# Patient Record
Sex: Female | Born: 1945 | Race: White | Hispanic: No | Marital: Married | Smoking: Former smoker
Health system: Southern US, Community
[De-identification: ages and names within clinical notes are randomized; demographics above are authoritative.]

## PROBLEM LIST (undated history)

## (undated) DIAGNOSIS — I4891 Unspecified atrial fibrillation: Secondary | ICD-10-CM

## (undated) DIAGNOSIS — F32A Depression, unspecified: Secondary | ICD-10-CM

## (undated) DIAGNOSIS — Z87442 Personal history of urinary calculi: Secondary | ICD-10-CM

## (undated) DIAGNOSIS — I10 Essential (primary) hypertension: Secondary | ICD-10-CM

## (undated) DIAGNOSIS — F419 Anxiety disorder, unspecified: Secondary | ICD-10-CM

## (undated) DIAGNOSIS — C4491 Basal cell carcinoma of skin, unspecified: Secondary | ICD-10-CM

## (undated) DIAGNOSIS — D332 Benign neoplasm of brain, unspecified: Secondary | ICD-10-CM

## (undated) DIAGNOSIS — Z9889 Other specified postprocedural states: Secondary | ICD-10-CM

## (undated) DIAGNOSIS — R238 Other skin changes: Secondary | ICD-10-CM

## (undated) DIAGNOSIS — F329 Major depressive disorder, single episode, unspecified: Secondary | ICD-10-CM

## (undated) DIAGNOSIS — M1990 Unspecified osteoarthritis, unspecified site: Secondary | ICD-10-CM

## (undated) DIAGNOSIS — L409 Psoriasis, unspecified: Secondary | ICD-10-CM

## (undated) DIAGNOSIS — J849 Interstitial pulmonary disease, unspecified: Secondary | ICD-10-CM

## (undated) DIAGNOSIS — C801 Malignant (primary) neoplasm, unspecified: Secondary | ICD-10-CM

## (undated) DIAGNOSIS — R112 Nausea with vomiting, unspecified: Secondary | ICD-10-CM

## (undated) DIAGNOSIS — M48061 Spinal stenosis, lumbar region without neurogenic claudication: Secondary | ICD-10-CM

## (undated) DIAGNOSIS — C439 Malignant melanoma of skin, unspecified: Secondary | ICD-10-CM

## (undated) DIAGNOSIS — R918 Other nonspecific abnormal finding of lung field: Secondary | ICD-10-CM

## (undated) DIAGNOSIS — R233 Spontaneous ecchymoses: Secondary | ICD-10-CM

## (undated) DIAGNOSIS — N281 Cyst of kidney, acquired: Secondary | ICD-10-CM

## (undated) DIAGNOSIS — R12 Heartburn: Secondary | ICD-10-CM

## (undated) HISTORY — PX: NECK SURGERY: SHX720

## (undated) HISTORY — PX: ABDOMINAL HYSTERECTOMY: SHX81

## (undated) HISTORY — PX: GALLBLADDER SURGERY: SHX652

## (undated) HISTORY — PX: COLONOSCOPY: SHX174

## (undated) HISTORY — PX: BRAIN SURGERY: SHX531

## (undated) HISTORY — PX: CATARACT EXTRACTION, BILATERAL: SHX1313

## (undated) HISTORY — PX: BREAST REDUCTION SURGERY: SHX8

---
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
## 2016-02-28 ENCOUNTER — Other Ambulatory Visit: Payer: Self-pay | Admitting: Neurosurgery

## 2016-03-07 ENCOUNTER — Telehealth: Payer: Self-pay | Admitting: Vascular Surgery

## 2016-03-07 NOTE — Telephone Encounter (Signed)
LVM on home # about appt on 2/13, req a call back to confirm they got the appt info, will mail lttr as well

## 2016-03-07 NOTE — Telephone Encounter (Signed)
-----   Message from Denman George, RN sent at 03/07/2016 11:35 AM EST ----- Regarding: needs appt. with Dr. Donnetta Hutching Please schedule appt. with Dr. Donnetta Hutching prior to ALIF scheduled for 04/02/16; please remind her to bring copy of L-S spine films to appt.

## 2016-03-20 ENCOUNTER — Encounter: Payer: Self-pay | Admitting: Vascular Surgery

## 2016-03-21 ENCOUNTER — Other Ambulatory Visit: Payer: Self-pay

## 2016-03-27 ENCOUNTER — Encounter (HOSPITAL_COMMUNITY)
Admission: RE | Admit: 2016-03-27 | Discharge: 2016-03-27 | Disposition: A | Discharge status: Home / Self Care | Payer: Medicare Other | Source: Ambulatory Visit | Attending: Neurosurgery | Admitting: Neurosurgery

## 2016-03-27 ENCOUNTER — Encounter (HOSPITAL_COMMUNITY): Payer: Self-pay

## 2016-03-27 ENCOUNTER — Encounter: Payer: Self-pay | Admitting: Vascular Surgery

## 2016-03-27 ENCOUNTER — Ambulatory Visit (INDEPENDENT_AMBULATORY_CARE_PROVIDER_SITE_OTHER): Payer: Medicare Other | Admitting: Vascular Surgery

## 2016-03-27 VITALS — BP 168/80 | HR 62 | Temp 99.2°F | Resp 18 | Ht 67.0 in | Wt 197.2 lb

## 2016-03-27 DIAGNOSIS — M51379 Other intervertebral disc degeneration, lumbosacral region without mention of lumbar back pain or lower extremity pain: Secondary | ICD-10-CM

## 2016-03-27 DIAGNOSIS — Z7901 Long term (current) use of anticoagulants: Secondary | ICD-10-CM | POA: Insufficient documentation

## 2016-03-27 DIAGNOSIS — Z01812 Encounter for preprocedural laboratory examination: Secondary | ICD-10-CM | POA: Diagnosis not present

## 2016-03-27 DIAGNOSIS — Z79899 Other long term (current) drug therapy: Secondary | ICD-10-CM | POA: Diagnosis not present

## 2016-03-27 DIAGNOSIS — Z01818 Encounter for other preprocedural examination: Secondary | ICD-10-CM | POA: Insufficient documentation

## 2016-03-27 DIAGNOSIS — Z87891 Personal history of nicotine dependence: Secondary | ICD-10-CM | POA: Diagnosis not present

## 2016-03-27 DIAGNOSIS — Z0183 Encounter for blood typing: Secondary | ICD-10-CM | POA: Diagnosis not present

## 2016-03-27 DIAGNOSIS — Z85820 Personal history of malignant melanoma of skin: Secondary | ICD-10-CM | POA: Diagnosis not present

## 2016-03-27 DIAGNOSIS — I10 Essential (primary) hypertension: Secondary | ICD-10-CM | POA: Insufficient documentation

## 2016-03-27 DIAGNOSIS — I4891 Unspecified atrial fibrillation: Secondary | ICD-10-CM | POA: Insufficient documentation

## 2016-03-27 DIAGNOSIS — M48061 Spinal stenosis, lumbar region without neurogenic claudication: Secondary | ICD-10-CM | POA: Insufficient documentation

## 2016-03-27 DIAGNOSIS — F419 Anxiety disorder, unspecified: Secondary | ICD-10-CM | POA: Insufficient documentation

## 2016-03-27 DIAGNOSIS — M5137 Other intervertebral disc degeneration, lumbosacral region: Secondary | ICD-10-CM | POA: Diagnosis not present

## 2016-03-27 DIAGNOSIS — Z85828 Personal history of other malignant neoplasm of skin: Secondary | ICD-10-CM | POA: Insufficient documentation

## 2016-03-27 DIAGNOSIS — F329 Major depressive disorder, single episode, unspecified: Secondary | ICD-10-CM | POA: Insufficient documentation

## 2016-03-27 DIAGNOSIS — T148XXA Other injury of unspecified body region, initial encounter: Secondary | ICD-10-CM

## 2016-03-27 DIAGNOSIS — M1990 Unspecified osteoarthritis, unspecified site: Secondary | ICD-10-CM | POA: Diagnosis not present

## 2016-03-27 DIAGNOSIS — R238 Other skin changes: Secondary | ICD-10-CM

## 2016-03-27 HISTORY — DX: Major depressive disorder, single episode, unspecified: F32.9

## 2016-03-27 HISTORY — DX: Unspecified osteoarthritis, unspecified site: M19.90

## 2016-03-27 HISTORY — DX: Cyst of kidney, acquired: N28.1

## 2016-03-27 HISTORY — DX: Other specified postprocedural states: Z98.890

## 2016-03-27 HISTORY — DX: Heartburn: R12

## 2016-03-27 HISTORY — DX: Anxiety disorder, unspecified: F41.9

## 2016-03-27 HISTORY — DX: Depression, unspecified: F32.A

## 2016-03-27 HISTORY — DX: Benign neoplasm of brain, unspecified: D33.2

## 2016-03-27 HISTORY — DX: Unspecified atrial fibrillation: I48.91

## 2016-03-27 HISTORY — DX: Other specified postprocedural states: R11.2

## 2016-03-27 HISTORY — DX: Other skin changes: R23.8

## 2016-03-27 HISTORY — DX: Spontaneous ecchymoses: R23.3

## 2016-03-27 HISTORY — DX: Personal history of urinary calculi: Z87.442

## 2016-03-27 HISTORY — DX: Essential (primary) hypertension: I10

## 2016-03-27 HISTORY — DX: Spinal stenosis, lumbar region without neurogenic claudication: M48.061

## 2016-03-27 HISTORY — DX: Malignant (primary) neoplasm, unspecified: C80.1

## 2016-03-27 LAB — ABO/RH: ABO/RH(D): O POS

## 2016-03-27 LAB — CBC
HCT: 35.8 % — ABNORMAL LOW (ref 36.0–46.0)
Hemoglobin: 11.7 g/dL — ABNORMAL LOW (ref 12.0–15.0)
MCH: 29.2 pg (ref 26.0–34.0)
MCHC: 32.7 g/dL (ref 30.0–36.0)
MCV: 89.3 fL (ref 78.0–100.0)
Platelets: 207 10*3/uL (ref 150–400)
RBC: 4.01 MIL/uL (ref 3.87–5.11)
RDW: 12.9 % (ref 11.5–15.5)
WBC: 9.5 10*3/uL (ref 4.0–10.5)

## 2016-03-27 LAB — BASIC METABOLIC PANEL
Anion gap: 10 (ref 5–15)
BUN: 19 mg/dL (ref 6–20)
CO2: 27 mmol/L (ref 22–32)
Calcium: 9.5 mg/dL (ref 8.9–10.3)
Chloride: 102 mmol/L (ref 101–111)
Creatinine, Ser: 0.87 mg/dL (ref 0.44–1.00)
GFR calc Af Amer: >60 mL/min (ref >60)
GFR calc non Af Amer: >60 mL/min (ref >60)
Glucose, Bld: 110 mg/dL — ABNORMAL HIGH (ref 65–99)
Potassium: 3.9 mmol/L (ref 3.5–5.1)
Sodium: 139 mmol/L (ref 135–145)

## 2016-03-27 LAB — TYPE AND SCREEN
ABO/RH(D): O POS
Antibody Screen: NEGATIVE

## 2016-03-27 LAB — SURGICAL PCR SCREEN
MRSA, PCR: NEGATIVE
Staphylococcus aureus: NEGATIVE

## 2016-03-27 MED ORDER — CARRASYN HYDROGEL WOUND DRESS EX GEL
CUTANEOUS | 0 refills | Status: DC | PRN
Start: 2016-03-27 — End: 2017-04-02

## 2016-03-27 NOTE — Progress Notes (Signed)
Vascular and Vein Specialist of [facility]  Patient name: Rebecca Houston MRN: [id number] DOB: 12/09/1945 Sex: female  REASON FOR CONSULT: Discuss anterior exposure for L5-S1 disc surgery  HPI: Rebecca Houston is a 71 y.o. female, who is seen for severe degenerative disc disease. She reports this is been progressive over the course of several years and now has difficulty with pain in both lower extremities as well. She has had injections with minimal relief in his had persistent pain. She's been seen in consultation with Dr. Vertell Limber who is recommended anterior exposure for L5-S1 disc fusion. She is seeing me for further discussion of this today. She's had no prior abdominal surgeries. She has no history of peripheral vascular occlusive disease. She does have a recent skin tear over her left pretibial area approximately 3 weeks ago. She reports that she struck a laundry door and had a skin tear which is been very slow to heal. She has had appropriate treatment to date with normal saline wet-to-dry. She has a very nice granulating base.  Past Medical History:  Diagnosis Date  . A-fib ([place])   . Anxiety   . Arthritis   . Brain tumor (benign) ([place])   . Bruises easily   . Cancer ([place])    melanoma on neck  . Depression   . Heartburn   . History of kidney stones   . Hypertension   . Lumbar foraminal stenosis    L5-S1  . PONV (postoperative nausea and vomiting)   . Renal cyst     Family History  Problem Relation Age of Onset  . Stroke Mother   . Dementia Mother   . Heart disease Father   . Breast cancer Sister     SOCIAL HISTORY: Social History   Social History  . Marital status: Married    Spouse name: N/A  . Number of children: N/A  . Years of education: N/A   Occupational History  . Not on file.   Social History Main Topics  . Smoking status: Former Research scientist (life sciences)  . Smokeless tobacco: Never Used     Comment: quit smoking cigarettes in 2014  .  Alcohol use No  . Drug use: No  . Sexual activity: Not on file   Other Topics Concern  . Not on file   Social History Narrative  . No narrative on file    No Known Allergies  Current Outpatient Prescriptions  Medication Sig Dispense Refill  . ALPRAZolam (XANAX) 0.5 MG tablet Take 0.5 mg by mouth 2 (two) times daily as needed for anxiety.    . carvedilol (COREG) 12.5 MG tablet Take 12.5 mg by mouth 2 (two) times daily with a meal.    . escitalopram (LEXAPRO) 20 MG tablet Take 20 mg by mouth every evening.    Marland Kitchen HYDROcodone-acetaminophen (NORCO/VICODIN) 5-325 MG tablet Take 1 tablet by mouth every 4 (four) hours as needed for moderate pain.    Marland Kitchen lisinopril (PRINIVIL,ZESTRIL) 20 MG tablet Take 20 mg by mouth 2 (two) times daily.    Marland Kitchen omeprazole (PRILOSEC) 20 MG capsule Take 20 mg by mouth every other day.    . rivaroxaban (XARELTO) 20 MG TABS tablet Take 20 mg by mouth daily with supper.    . cephALEXin (KEFLEX) 500 MG capsule Take 500 mg by mouth 3 (three) times daily. 10 day course started 03-12-2016 for wound on leg    . mupirocin ointment (BACTROBAN) 2 % Place 1 application into the nose daily as needed (wound care).    Marland Kitchen  Wound Dressings (ALLANTOIN) gel Apply topically as needed for wound care. Thin application over wound and cover with dry dressing wrapped around leg. 85 g 0   No current facility-administered medications for this visit.     REVIEW OF SYSTEMS:  [X]  denotes positive finding, [ ]  denotes negative finding Cardiac  Comments:  Chest pain or chest pressure:    Shortness of breath upon exertion:    Short of breath when lying flat:    Irregular heart rhythm:        Vascular    Pain in calf, thigh, or hip brought on by ambulation: x Neurogenic   Pain in feet at night that wakes you up from your sleep:     Blood clot in your veins:    Leg swelling:         Pulmonary    Oxygen at home:    Productive cough:     Wheezing:         Neurologic    Sudden weakness in  arms or legs:     Sudden numbness in arms or legs:     Sudden onset of difficulty speaking or slurred speech:    Temporary loss of vision in one eye:     Problems with dizziness:         Gastrointestinal    Blood in stool:     Vomited blood:         Genitourinary    Burning when urinating:     Blood in urine:        Psychiatric    Major depression:         Hematologic    Bleeding problems:    Problems with blood clotting too easily:        Skin    Rashes or ulcers:        Constitutional    Fever or chills:      PHYSICAL EXAM: Vitals:   03/27/16 1456 03/27/16 1510  BP: (!) 179/80 (!) 168/80  Pulse: 62   Resp: 18   Temp: 99.2 F (37.3 C)   TempSrc: Oral   SpO2: 99%   Weight: 197 lb 3.2 oz (89.4 kg)   Height: 5\' 7"  (1.702 m)     GENERAL: The patient is a well-nourished female, in no acute distress. The vital signs are documented above. CARDIOVASCULAR: Carotid arteries without bruits bilaterally. Radial and dorsalis pedis pulses 2+ bilaterally PULMONARY: There is good air exchange  ABDOMEN: Soft and non-tender  MUSCULOSKELETAL: There are no major deformities or cyanosis. NEUROLOGIC: No focal weakness or paresthesias are detected. SKIN: She does have a 3 x 3 cm ulceration over her left medial anterior calf is a good granulating base PSYCHIATRIC: The patient has a normal affect.  DATA:  She has an MRI but no plain films or CT to evaluate calcifications of her aortoiliac system  MEDICAL ISSUES: Discussed my role for anterior exposure for L5-S1 spine surgery. Discussed mobilization of the rectus muscle, intraperitoneal contents, left ureter and arterial venous structures overlying the spine. Also explained the potential for injury to these. Do not see any contraindication to proceeding. I did discuss the issue of her skin tear with Dr. Vertell Limber. I do not see any evidence of gross infection and he feels comfortable proceeding with surgery as scheduled on 2/19   Rosetta Posner, MD [facility] Vascular and Vein Specialists of [facility] Tel [phone] Pager [phone]

## 2016-03-27 NOTE — Pre-Procedure Instructions (Signed)
    Rebecca Houston  03/27/2016      [facility] [name], [address] [place] [address] Phone: [phone] Fax: [phone]    Your procedure is scheduled on Monday, April 02, 2016  Report to [facility] Admitting at 5:30 A.M.  Call this number if you have problems the morning of surgery:  [phone]   Remember: Follow doctors instructions regarding Xarelto  Do not eat food or drink liquids after midnight Sunday, April 01, 2016  Take these medicines the morning of surgery with A SIP OF WATER : carvedilol (COREG),  omeprazole (PRILOSEC), if needed: Hydrocodone for pain,ALPRAZolam [name]) for anxiety  Stop taking Aspirin, vitamins, fish oil and herbal medications. Do not take any NSAIDs ie: Ibuprofen, Advil, Naproxen, BC and Goody Powder or any medication containing Aspirin; stop now.  Do not wear jewelry, make-up or nail polish.  Do not wear lotions, powders, or perfumes, or deoderant.  Do not shave 48 hours prior to surgery.    Do not bring valuables to the hospital.  [facility] is not responsible for any belongings or valuables.  Contacts, dentures or bridgework may not be worn into surgery.  Leave your suitcase in the car.  After surgery it may be brought to your room. For patients admitted to the hospital, discharge time will be determined by your treatment team. Special instructions: Shower the night before surgery and the morning of surgery with CHG. Please read over the following fact sheets that you were given. Pain Booklet, Coughing and Deep Breathing, Blood Transfusion Information, MRSA Information and Surgical Site Infection Prevention

## 2016-03-27 NOTE — Progress Notes (Signed)
Pt denies SOB and chest pain. Pt stated that she is under the care of Dr. Burney Gauze, Cardiology at [facility]   [place], [place]). Pt denies having a cardiac cath but stated that a stress test and echo was performed; records requested from cardiologist. Pt denies having recent labs but stated that an EKG and chest x ray was performed within the last year at [place], [facility] by PCP Dr. Tawny Asal; records requested. Janett Billow clarified that pt is to take last dose of Xarelto on Friday, 03/30/16 ( instructions hand written on pt pre-op instruction sheet and pt verbalized understanding of stopping Xarelto 48 hours prior to procedure) when I informed her that pt stated that she took her last dose of Xarelto yesterday because she received a letter to stop 5-7 days prior to procedure.  Pt chart forwarded to anesthesia to review pt cardiac history and clearance note on chart.

## 2016-03-28 NOTE — Progress Notes (Addendum)
Anesthesia Chart Review: Patient is a 71 year old female scheduled for L5-S1 anterior lumbar interbody fusion on 04/02/16 by Dr. Vertell Limber. Anterior exposure by Dr. Curt Jews.  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  Cardiologist is Dr. Robin Searing with [place], [facility] ([place]). He wrote, "I think she is at acceptable risk level to proceed with planned surgery from a cardiac standpoint. She should hold Xarelto for 48 hours prior to surgery so last oh should be 72 hours prior to surgery. Resume unable after surgery, usually 48-72 hours afterwards."  PCP is Dr. Pat Kocher with [place], [place].  Meds include Xanax, Coreg, Keflex X 10 days (started 03/12/16), Lexapro, Norco, lisinopril, Prilosec, Xarelto (patient had stopped on 03/26/16 but was informed at PAT that per Dr. Melven Sartorius staff, last dose should be 03/30/16).   BP (P) 134/62   Pulse (P) 64   Temp (P) 36.2 C (Oral)   Resp (P) 20   Ht (P) 5\' 7"  (1.702 m)   Wt (P) 192 lb 9 oz (87.3 kg)   SpO2 (P) 100%   BMI (P) 30.16 kg/m   EKG 09/21/15 (PCP): Sinus bradycardia at 46 bpm, poor R-wave progression, probable normal variant. (HR 64 bpm at PAT.)  Last EKG, stress, and echo report requested from Dr. West Pugh office, but are still pending. However, the following were outlined in Dr. West Pugh 08/15/15 office note: - ECHO 02/2013: EF 55-85%, concentric LVH. -Stress echo 02/2013: Walk 7 minutes, no ischemia. -PFTs 01/2013: Mild restriction.  CXR 09/21/15 (PCP): Findings: The trachea is midline. The heart size is within normal limits. The lungs are adequately aerated. There is no focal consolidation. There is no pneumothorax noted. There is no evidence  of decompensated heart failure. There is no evidence of pleural effusion. Impression: No acute pulmonary process.  Preoperative labs noted. H/H 11.7/35.8. Glucose 110, Cr 0.87. T&S done. She is for a PT/INR on the day of surgery.  If no acute changes then I anticipate that she can proceed as planned.  George Hugh [facility] Short Stay Center/Anesthesiology Phone [phone] 03/28/2016 1:27 PM  Addendum: Records received from [facility].  Stress Echo 02/16/13:  Stress ECG: Sinus tachycardia. The stress ECG was negative for ischemia. Maximal heart rate during stress was 141 bpm (92% of maximal predicted heart rate). Stress echo: Left ventricular ejection fraction was normal at rest and with stress. Baseline LVEF 60%. No LV regional wall motion abnormalities at rest or with stress.   Echo 02/16/13: Summary: 1. Left ventricle: The cavity size was normal. Wall thickness was mildly to moderately increased in a pattern of concentric LVH. Systolic function was normal. The estimated EF was in the range of 55-65%. 2. Right ventricle: The cavity size was normal. Systolic function was normal. 3. Pulmonary arteries: Systolic pressure could not be accurately estimated. 4. No significant valvular abnormalities. Trivial mitral regurgitation. Trivial to mild tricuspid regurgitation. Trivial pulmonic regurgitation.  George Hugh [facility] Short Stay Center/Anesthesiology Phone [phone] 03/30/2016 12:20 PM

## 2016-04-02 ENCOUNTER — Inpatient Hospital Stay (HOSPITAL_COMMUNITY): Payer: Medicare Other

## 2016-04-02 ENCOUNTER — Inpatient Hospital Stay (HOSPITAL_COMMUNITY): Payer: Medicare Other | Admitting: Vascular Surgery

## 2016-04-02 ENCOUNTER — Encounter (HOSPITAL_COMMUNITY): Payer: Self-pay | Admitting: *Deleted

## 2016-04-02 ENCOUNTER — Inpatient Hospital Stay (HOSPITAL_COMMUNITY)
Admission: RE | Admit: 2016-04-02 | Discharge: 2016-04-04 | DRG: 460 | Disposition: A | Discharge status: Home / Self Care | Payer: Medicare Other | Source: Ambulatory Visit | Attending: Neurosurgery | Admitting: Neurosurgery

## 2016-04-02 ENCOUNTER — Encounter (HOSPITAL_COMMUNITY)
Admission: RE | Disposition: A | Discharge status: Home / Self Care | Payer: Self-pay | Source: Ambulatory Visit | Attending: Neurosurgery

## 2016-04-02 DIAGNOSIS — Z6830 Body mass index (BMI) 30.0-30.9, adult: Secondary | ICD-10-CM | POA: Diagnosis not present

## 2016-04-02 DIAGNOSIS — I10 Essential (primary) hypertension: Secondary | ICD-10-CM | POA: Diagnosis present

## 2016-04-02 DIAGNOSIS — C439 Malignant melanoma of skin, unspecified: Secondary | ICD-10-CM | POA: Diagnosis present

## 2016-04-02 DIAGNOSIS — M48062 Spinal stenosis, lumbar region with neurogenic claudication: Secondary | ICD-10-CM

## 2016-04-02 DIAGNOSIS — F419 Anxiety disorder, unspecified: Secondary | ICD-10-CM | POA: Diagnosis present

## 2016-04-02 DIAGNOSIS — M5117 Intervertebral disc disorders with radiculopathy, lumbosacral region: Secondary | ICD-10-CM | POA: Diagnosis present

## 2016-04-02 DIAGNOSIS — I4891 Unspecified atrial fibrillation: Secondary | ICD-10-CM | POA: Diagnosis present

## 2016-04-02 DIAGNOSIS — M5126 Other intervertebral disc displacement, lumbar region: Secondary | ICD-10-CM | POA: Diagnosis present

## 2016-04-02 DIAGNOSIS — Z8249 Family history of ischemic heart disease and other diseases of the circulatory system: Secondary | ICD-10-CM | POA: Diagnosis not present

## 2016-04-02 DIAGNOSIS — M5137 Other intervertebral disc degeneration, lumbosacral region: Secondary | ICD-10-CM

## 2016-04-02 DIAGNOSIS — M48061 Spinal stenosis, lumbar region without neurogenic claudication: Secondary | ICD-10-CM | POA: Diagnosis present

## 2016-04-02 DIAGNOSIS — Z87891 Personal history of nicotine dependence: Secondary | ICD-10-CM

## 2016-04-02 DIAGNOSIS — M549 Dorsalgia, unspecified: Secondary | ICD-10-CM | POA: Diagnosis present

## 2016-04-02 DIAGNOSIS — Z7901 Long term (current) use of anticoagulants: Secondary | ICD-10-CM | POA: Diagnosis not present

## 2016-04-02 DIAGNOSIS — Z419 Encounter for procedure for purposes other than remedying health state, unspecified: Secondary | ICD-10-CM

## 2016-04-02 DIAGNOSIS — K219 Gastro-esophageal reflux disease without esophagitis: Secondary | ICD-10-CM | POA: Diagnosis present

## 2016-04-02 HISTORY — PX: ANTERIOR LUMBAR FUSION: SHX1170

## 2016-04-02 HISTORY — PX: ABDOMINAL EXPOSURE: SHX5708

## 2016-04-02 LAB — PROTIME-INR
INR: 0.92
Prothrombin Time: 12.3 seconds (ref 11.4–15.2)

## 2016-04-02 SURGERY — ANTERIOR LUMBAR FUSION 1 LEVEL
Anesthesia: General

## 2016-04-02 MED ORDER — MUPIROCIN 2 % EX OINT: 1.0000 "application " | TOPICAL_OINTMENT | Freq: Every day | CUTANEOUS | Status: DC | PRN

## 2016-04-02 MED ORDER — ARTIFICIAL TEARS OP OINT
TOPICAL_OINTMENT | OPHTHALMIC | Status: AC
  Filled 2016-04-02: qty 3.5

## 2016-04-02 MED ORDER — PHENYLEPHRINE HCL 10 MG/ML IJ SOLN
INTRAMUSCULAR | Status: DC | PRN
  Administered 2016-04-02: 40 mg via INTRAVENOUS
  Administered 2016-04-02: 60 mg via INTRAVENOUS

## 2016-04-02 MED ORDER — ESCITALOPRAM OXALATE 20 MG PO TABS
20.0000 mg | ORAL_TABLET | Freq: Every evening | ORAL | Status: DC
  Administered 2016-04-02 – 2016-04-03 (×2): 20 mg via ORAL
  Filled 2016-04-02 (×2): qty 1

## 2016-04-02 MED ORDER — LIDOCAINE 2% (20 MG/ML) 5 ML SYRINGE
INTRAMUSCULAR | Status: AC
  Filled 2016-04-02: qty 5

## 2016-04-02 MED ORDER — DEXAMETHASONE SODIUM PHOSPHATE 10 MG/ML IJ SOLN
INTRAMUSCULAR | Status: AC
  Filled 2016-04-02: qty 1

## 2016-04-02 MED ORDER — FENTANYL CITRATE (PF) 100 MCG/2ML IJ SOLN
INTRAMUSCULAR | Status: AC
  Filled 2016-04-02: qty 4

## 2016-04-02 MED ORDER — THROMBIN 5000 UNITS EX SOLR
CUTANEOUS | Status: AC
  Filled 2016-04-02: qty 5000

## 2016-04-02 MED ORDER — PHENOL 1.4 % MT LIQD: 1.0000 | OROMUCOSAL | Status: DC | PRN

## 2016-04-02 MED ORDER — PROMETHAZINE HCL 25 MG/ML IJ SOLN: 6.2500 mg | INTRAMUSCULAR | Status: DC | PRN

## 2016-04-02 MED ORDER — HEMOSTATIC AGENTS (NO CHARGE) OPTIME
TOPICAL | Status: DC | PRN
  Administered 2016-04-02: 1 via TOPICAL

## 2016-04-02 MED ORDER — KCL IN DEXTROSE-NACL 20-5-0.45 MEQ/L-%-% IV SOLN
INTRAVENOUS | Status: DC
  Administered 2016-04-02: 16:00:00 via INTRAVENOUS
  Filled 2016-04-02: qty 1000

## 2016-04-02 MED ORDER — THROMBIN 5000 UNITS EX SOLR
CUTANEOUS | Status: AC
  Filled 2016-04-02: qty 10000

## 2016-04-02 MED ORDER — ACETAMINOPHEN 650 MG RE SUPP: 650.0000 mg | RECTAL | Status: DC | PRN

## 2016-04-02 MED ORDER — THROMBIN 5000 UNITS EX SOLR
CUTANEOUS | Status: DC | PRN
  Administered 2016-04-02 (×2): 5000 [IU] via TOPICAL

## 2016-04-02 MED ORDER — CEFAZOLIN SODIUM-DEXTROSE 2-4 GM/100ML-% IV SOLN
2.0000 g | INTRAVENOUS | Status: AC
  Administered 2016-04-02: 2 g via INTRAVENOUS
  Filled 2016-04-02: qty 100

## 2016-04-02 MED ORDER — THROMBIN 5000 UNITS EX SOLR
OROMUCOSAL | Status: DC | PRN
  Administered 2016-04-02: 09:00:00 via TOPICAL

## 2016-04-02 MED ORDER — ESMOLOL HCL 100 MG/10ML IV SOLN
INTRAVENOUS | Status: AC
  Filled 2016-04-02: qty 10

## 2016-04-02 MED ORDER — CHLORHEXIDINE GLUCONATE CLOTH 2 % EX PADS: 6.0000 | MEDICATED_PAD | Freq: Once | CUTANEOUS | Status: DC

## 2016-04-02 MED ORDER — SUGAMMADEX SODIUM 200 MG/2ML IV SOLN
INTRAVENOUS | Status: DC | PRN
  Administered 2016-04-02: 200 mg via INTRAVENOUS

## 2016-04-02 MED ORDER — GLYCOPYRROLATE 0.2 MG/ML IJ SOLN
INTRAMUSCULAR | Status: DC | PRN
  Administered 2016-04-02: .2 mg via INTRAVENOUS

## 2016-04-02 MED ORDER — ONDANSETRON HCL 4 MG/2ML IJ SOLN
INTRAMUSCULAR | Status: AC
  Filled 2016-04-02: qty 2

## 2016-04-02 MED ORDER — SUGAMMADEX SODIUM 200 MG/2ML IV SOLN
INTRAVENOUS | Status: AC
  Filled 2016-04-02: qty 2

## 2016-04-02 MED ORDER — FENTANYL CITRATE (PF) 100 MCG/2ML IJ SOLN
INTRAMUSCULAR | Status: AC
  Filled 2016-04-02: qty 2

## 2016-04-02 MED ORDER — POLYETHYLENE GLYCOL 3350 17 G PO PACK
17.0000 g | PACK | Freq: Every day | ORAL | Status: DC | PRN
  Administered 2016-04-02: 17 g via ORAL
  Filled 2016-04-02: qty 1

## 2016-04-02 MED ORDER — MENTHOL 3 MG MT LOZG: 1.0000 | LOZENGE | OROMUCOSAL | Status: DC | PRN

## 2016-04-02 MED ORDER — SCOPOLAMINE 1 MG/3DAYS TD PT72
MEDICATED_PATCH | TRANSDERMAL | Status: DC | PRN
  Administered 2016-04-02: 1 via TRANSDERMAL

## 2016-04-02 MED ORDER — PROPOFOL 10 MG/ML IV BOLUS
INTRAVENOUS | Status: AC
  Filled 2016-04-02: qty 20

## 2016-04-02 MED ORDER — METHOCARBAMOL 500 MG PO TABS
500.0000 mg | ORAL_TABLET | Freq: Four times a day (QID) | ORAL | Status: DC | PRN
  Administered 2016-04-02 – 2016-04-04 (×4): 500 mg via ORAL
  Filled 2016-04-02 (×3): qty 1

## 2016-04-02 MED ORDER — EPHEDRINE SULFATE 50 MG/ML IJ SOLN
INTRAMUSCULAR | Status: DC | PRN
  Administered 2016-04-02 (×2): 10 mg via INTRAVENOUS

## 2016-04-02 MED ORDER — MIDAZOLAM HCL 2 MG/2ML IJ SOLN: 0.5000 mg | Freq: Once | INTRAMUSCULAR | Status: DC | PRN

## 2016-04-02 MED ORDER — METHOCARBAMOL 500 MG PO TABS
ORAL_TABLET | ORAL | Status: AC
  Administered 2016-04-02: 500 mg via ORAL
  Filled 2016-04-02: qty 1

## 2016-04-02 MED ORDER — DEXAMETHASONE SODIUM PHOSPHATE 10 MG/ML IJ SOLN
INTRAMUSCULAR | Status: DC | PRN
  Administered 2016-04-02: 10 mg via INTRAVENOUS

## 2016-04-02 MED ORDER — ESMOLOL HCL 100 MG/10ML IV SOLN
INTRAVENOUS | Status: DC | PRN
  Administered 2016-04-02: 15 mg via INTRAVENOUS
  Administered 2016-04-02: 20 mg via INTRAVENOUS

## 2016-04-02 MED ORDER — SODIUM CHLORIDE 0.9% FLUSH
3.0000 mL | Freq: Two times a day (BID) | INTRAVENOUS | Status: DC
  Administered 2016-04-02: 3 mL via INTRAVENOUS

## 2016-04-02 MED ORDER — ONDANSETRON HCL 4 MG/2ML IJ SOLN
INTRAMUSCULAR | Status: DC | PRN
  Administered 2016-04-02: 4 mg via INTRAVENOUS

## 2016-04-02 MED ORDER — SCOPOLAMINE 1 MG/3DAYS TD PT72
MEDICATED_PATCH | TRANSDERMAL | Status: AC
  Filled 2016-04-02: qty 1

## 2016-04-02 MED ORDER — ACETAMINOPHEN 325 MG PO TABS: 650.0000 mg | ORAL_TABLET | Freq: Four times a day (QID) | ORAL | Status: DC | PRN

## 2016-04-02 MED ORDER — CEFAZOLIN SODIUM-DEXTROSE 2-4 GM/100ML-% IV SOLN
2.0000 g | Freq: Three times a day (TID) | INTRAVENOUS | Status: AC
  Administered 2016-04-02 (×2): 2 g via INTRAVENOUS
  Filled 2016-04-02 (×2): qty 100

## 2016-04-02 MED ORDER — ROCURONIUM BROMIDE 100 MG/10ML IV SOLN
INTRAVENOUS | Status: DC | PRN
  Administered 2016-04-02: 50 mg via INTRAVENOUS
  Administered 2016-04-02: 20 mg via INTRAVENOUS

## 2016-04-02 MED ORDER — FENTANYL CITRATE (PF) 100 MCG/2ML IJ SOLN
INTRAMUSCULAR | Status: DC | PRN
  Administered 2016-04-02: 100 ug via INTRAVENOUS
  Administered 2016-04-02: 50 ug via INTRAVENOUS
  Administered 2016-04-02: 150 ug via INTRAVENOUS

## 2016-04-02 MED ORDER — LISINOPRIL 20 MG PO TABS
20.0000 mg | ORAL_TABLET | Freq: Two times a day (BID) | ORAL | Status: DC
  Administered 2016-04-02 – 2016-04-03 (×3): 20 mg via ORAL
  Filled 2016-04-02 (×3): qty 1

## 2016-04-02 MED ORDER — CEPHALEXIN 500 MG PO CAPS: 500.0000 mg | ORAL_CAPSULE | Freq: Three times a day (TID) | ORAL | Status: DC

## 2016-04-02 MED ORDER — LACTATED RINGERS IV SOLN
INTRAVENOUS | Status: DC | PRN
  Administered 2016-04-02 (×3): via INTRAVENOUS

## 2016-04-02 MED ORDER — HYDROCODONE-ACETAMINOPHEN 5-325 MG PO TABS: 1.0000 | ORAL_TABLET | ORAL | Status: DC | PRN

## 2016-04-02 MED ORDER — MIDAZOLAM HCL 5 MG/5ML IJ SOLN
INTRAMUSCULAR | Status: DC | PRN
  Administered 2016-04-02: 2 mg via INTRAVENOUS

## 2016-04-02 MED ORDER — EPHEDRINE 5 MG/ML INJ
INTRAVENOUS | Status: AC
  Filled 2016-04-02: qty 10

## 2016-04-02 MED ORDER — LIDOCAINE HCL (CARDIAC) 20 MG/ML IV SOLN
INTRAVENOUS | Status: DC | PRN
  Administered 2016-04-02: 20 mg via INTRATRACHEAL
  Administered 2016-04-02: 80 mg via INTRATRACHEAL

## 2016-04-02 MED ORDER — OXYCODONE HCL 5 MG PO TABS
ORAL_TABLET | ORAL | Status: AC
  Administered 2016-04-02: 10 mg via ORAL
  Filled 2016-04-02: qty 2

## 2016-04-02 MED ORDER — HYDROMORPHONE HCL 2 MG/ML IJ SOLN
INTRAMUSCULAR | Status: AC
  Filled 2016-04-02: qty 1

## 2016-04-02 MED ORDER — 0.9 % SODIUM CHLORIDE (POUR BTL) OPTIME
TOPICAL | Status: DC | PRN
  Administered 2016-04-02: 1000 mL

## 2016-04-02 MED ORDER — FLEET ENEMA 7-19 GM/118ML RE ENEM: 1.0000 | ENEMA | Freq: Once | RECTAL | Status: DC | PRN

## 2016-04-02 MED ORDER — ACETAMINOPHEN 650 MG RE SUPP: 650.0000 mg | Freq: Four times a day (QID) | RECTAL | Status: DC | PRN

## 2016-04-02 MED ORDER — ROCURONIUM BROMIDE 50 MG/5ML IV SOSY
PREFILLED_SYRINGE | INTRAVENOUS | Status: AC
  Filled 2016-04-02: qty 5

## 2016-04-02 MED ORDER — OXYCODONE HCL 5 MG PO TABS
5.0000 mg | ORAL_TABLET | ORAL | Status: DC | PRN
  Administered 2016-04-02 (×2): 10 mg via ORAL
  Administered 2016-04-02: 5 mg via ORAL
  Administered 2016-04-03 – 2016-04-04 (×4): 10 mg via ORAL
  Filled 2016-04-02: qty 2
  Filled 2016-04-02: qty 1
  Filled 2016-04-02 (×4): qty 2
  Filled 2016-04-02: qty 1

## 2016-04-02 MED ORDER — MIDAZOLAM HCL 2 MG/2ML IJ SOLN
INTRAMUSCULAR | Status: AC
  Filled 2016-04-02: qty 2

## 2016-04-02 MED ORDER — ACETAMINOPHEN 325 MG PO TABS
650.0000 mg | ORAL_TABLET | ORAL | Status: DC | PRN
Start: 2016-04-02 — End: 2016-04-04

## 2016-04-02 MED ORDER — MORPHINE SULFATE (PF) 4 MG/ML IV SOLN: 2.0000 mg | INTRAVENOUS | Status: DC | PRN

## 2016-04-02 MED ORDER — DEXMEDETOMIDINE HCL 200 MCG/2ML IV SOLN
INTRAVENOUS | Status: DC | PRN
  Administered 2016-04-02: 12 ug via INTRAVENOUS

## 2016-04-02 MED ORDER — PROPOFOL 10 MG/ML IV BOLUS
INTRAVENOUS | Status: DC | PRN
  Administered 2016-04-02: 150 mg via INTRAVENOUS

## 2016-04-02 MED ORDER — ALPRAZOLAM 0.5 MG PO TABS
0.5000 mg | ORAL_TABLET | Freq: Two times a day (BID) | ORAL | Status: DC | PRN
  Administered 2016-04-03 (×2): 0.5 mg via ORAL
  Filled 2016-04-02 (×2): qty 1

## 2016-04-02 MED ORDER — HYDROMORPHONE HCL 1 MG/ML IJ SOLN
0.2500 mg | INTRAMUSCULAR | Status: DC | PRN
  Administered 2016-04-02 (×2): 0.5 mg via INTRAVENOUS

## 2016-04-02 MED ORDER — CARRASYN HYDROGEL WOUND DRESS EX GEL: CUTANEOUS | Status: DC | PRN

## 2016-04-02 MED ORDER — DEXTROSE 5 % IV SOLN
500.0000 mg | Freq: Four times a day (QID) | INTRAVENOUS | Status: DC | PRN
  Filled 2016-04-02: qty 5

## 2016-04-02 MED ORDER — CARVEDILOL 6.25 MG PO TABS
12.5000 mg | ORAL_TABLET | Freq: Two times a day (BID) | ORAL | Status: DC
  Administered 2016-04-02 – 2016-04-03 (×3): 12.5 mg via ORAL
  Filled 2016-04-02 (×3): qty 2

## 2016-04-02 MED ORDER — MEPERIDINE HCL 25 MG/ML IJ SOLN: 6.2500 mg | INTRAMUSCULAR | Status: DC | PRN

## 2016-04-02 MED ORDER — DOCUSATE SODIUM 100 MG PO CAPS
100.0000 mg | ORAL_CAPSULE | Freq: Two times a day (BID) | ORAL | Status: DC
  Administered 2016-04-02 – 2016-04-03 (×4): 100 mg via ORAL
  Filled 2016-04-02 (×4): qty 1

## 2016-04-02 MED ORDER — SODIUM CHLORIDE 0.9% FLUSH: 3.0000 mL | INTRAVENOUS | Status: DC | PRN

## 2016-04-02 MED ORDER — ZOLPIDEM TARTRATE 5 MG PO TABS: 5.0000 mg | ORAL_TABLET | Freq: Every evening | ORAL | Status: DC | PRN

## 2016-04-02 MED ORDER — CHLORHEXIDINE GLUCONATE 4 % EX LIQD: 60.0000 mL | Freq: Once | CUTANEOUS | Status: DC

## 2016-04-02 MED ORDER — BISACODYL 10 MG RE SUPP: 10.0000 mg | Freq: Every day | RECTAL | Status: DC | PRN

## 2016-04-02 MED ORDER — PANTOPRAZOLE SODIUM 40 MG PO TBEC
40.0000 mg | DELAYED_RELEASE_TABLET | Freq: Every day | ORAL | Status: DC
  Administered 2016-04-02 – 2016-04-03 (×2): 40 mg via ORAL
  Filled 2016-04-02 (×2): qty 1

## 2016-04-02 MED ORDER — DEXMEDETOMIDINE HCL IN NACL 200 MCG/50ML IV SOLN
INTRAVENOUS | Status: AC
  Filled 2016-04-02: qty 50

## 2016-04-02 MED ORDER — ONDANSETRON HCL 4 MG/2ML IJ SOLN: 4.0000 mg | INTRAMUSCULAR | Status: DC | PRN

## 2016-04-02 MED ORDER — PHENYLEPHRINE 40 MCG/ML (10ML) SYRINGE FOR IV PUSH (FOR BLOOD PRESSURE SUPPORT)
PREFILLED_SYRINGE | INTRAVENOUS | Status: AC
  Filled 2016-04-02: qty 10

## 2016-04-02 MED FILL — Heparin Sodium (Porcine) Inj 1000 Unit/ML: INTRAMUSCULAR | Qty: 30 | Status: AC

## 2016-04-02 SURGICAL SUPPLY — 86 items
APPLIER CLIP 11 MED OPEN (CLIP) ×3
BASE TI BOLT 5.0X22.5 VARIABLE (Bolt) ×2 IMPLANT
BASE TI IMPLANT 8X38X28 15DEG (Neuro Prosthesis/Implant) ×3 IMPLANT
BASKET BONE COLLECTION (BASKET) IMPLANT
BOLT BASE TI 5X20 VARIABLE (Bolt) ×6 IMPLANT
BUR BARREL STRAIGHT FLUTE 4.0 (BURR) IMPLANT
CANISTER SUCT 3000ML PPV (MISCELLANEOUS) ×3 IMPLANT
CLIP APPLIE 11 MED OPEN (CLIP) ×1 IMPLANT
CLIP LIGATING EXTRA MED SLVR (CLIP) IMPLANT
CLIP LIGATING EXTRA SM BLUE (MISCELLANEOUS) IMPLANT
COVER BACK TABLE 60X90IN (DRAPES) ×3 IMPLANT
DECANTER SPIKE VIAL GLASS SM (MISCELLANEOUS) ×3 IMPLANT
DERMABOND ADVANCED (GAUZE/BANDAGES/DRESSINGS) ×2
DERMABOND ADVANCED .7 DNX12 (GAUZE/BANDAGES/DRESSINGS) ×1 IMPLANT
DRAPE C-ARM 42X72 X-RAY (DRAPES) ×3 IMPLANT
DRAPE C-ARMOR (DRAPES) ×3 IMPLANT
DRAPE INCISE IOBAN 66X45 STRL (DRAPES) ×3 IMPLANT
DRAPE LAPAROTOMY 100X72X124 (DRAPES) ×3 IMPLANT
DRAPE POUCH INSTRU U-SHP 10X18 (DRAPES) ×3 IMPLANT
DRSG OPSITE POSTOP 4X8 (GAUZE/BANDAGES/DRESSINGS) ×3 IMPLANT
DURAPREP 26ML APPLICATOR (WOUND CARE) ×3 IMPLANT
ELECT BLADE 4.0 EZ CLEAN MEGAD (MISCELLANEOUS) ×3
ELECT REM PT RETURN 9FT ADLT (ELECTROSURGICAL) ×3
ELECTRODE BLDE 4.0 EZ CLN MEGD (MISCELLANEOUS) ×1 IMPLANT
ELECTRODE REM PT RTRN 9FT ADLT (ELECTROSURGICAL) ×1 IMPLANT
GAUZE SPONGE 4X4 12PLY STRL (GAUZE/BANDAGES/DRESSINGS) IMPLANT
GAUZE SPONGE 4X4 16PLY XRAY LF (GAUZE/BANDAGES/DRESSINGS) IMPLANT
GLOVE BIO SURGEON STRL SZ8 (GLOVE) ×3 IMPLANT
GLOVE BIOGEL PI IND STRL 8 (GLOVE) ×1 IMPLANT
GLOVE BIOGEL PI IND STRL 8.5 (GLOVE) ×1 IMPLANT
GLOVE BIOGEL PI INDICATOR 8 (GLOVE) ×2
GLOVE BIOGEL PI INDICATOR 8.5 (GLOVE) ×2
GLOVE ECLIPSE 8.0 STRL XLNG CF (GLOVE) ×3 IMPLANT
GLOVE EXAM NITRILE LRG STRL (GLOVE) IMPLANT
GLOVE EXAM NITRILE XL STR (GLOVE) IMPLANT
GLOVE EXAM NITRILE XS STR PU (GLOVE) IMPLANT
GLOVE SS BIOGEL STRL SZ 7.5 (GLOVE) ×1 IMPLANT
GLOVE SUPERSENSE BIOGEL SZ 7.5 (GLOVE) ×2
GOWN STRL REUS W/ TWL LRG LVL3 (GOWN DISPOSABLE) ×2 IMPLANT
GOWN STRL REUS W/ TWL XL LVL3 (GOWN DISPOSABLE) ×1 IMPLANT
GOWN STRL REUS W/TWL 2XL LVL3 (GOWN DISPOSABLE) ×3 IMPLANT
GOWN STRL REUS W/TWL LRG LVL3 (GOWN DISPOSABLE) ×4
GOWN STRL REUS W/TWL XL LVL3 (GOWN DISPOSABLE) ×2
HEMOSTAT POWDER KIT SURGIFOAM (HEMOSTASIS) ×3 IMPLANT
INSERT FOGARTY 61MM (MISCELLANEOUS) IMPLANT
INSERT FOGARTY SM (MISCELLANEOUS) IMPLANT
KIT BASIN OR (CUSTOM PROCEDURE TRAY) ×3 IMPLANT
KIT INFUSE XX SMALL 0.7CC (Orthopedic Implant) ×3 IMPLANT
KIT ROOM TURNOVER OR (KITS) ×3 IMPLANT
LOOP VESSEL MAXI BLUE (MISCELLANEOUS) IMPLANT
LOOP VESSEL MINI RED (MISCELLANEOUS) IMPLANT
NEEDLE HYPO 25X1 1.5 SAFETY (NEEDLE) IMPLANT
NEEDLE SPNL 18GX3.5 QUINCKE PK (NEEDLE) ×6 IMPLANT
NS IRRIG 1000ML POUR BTL (IV SOLUTION) ×3 IMPLANT
PACK LAMINECTOMY NEURO (CUSTOM PROCEDURE TRAY) ×3 IMPLANT
PAD ARMBOARD 7.5X6 YLW CONV (MISCELLANEOUS) ×9 IMPLANT
PUTTY BONE ATTRAX 5CC STRIP (Putty) ×3 IMPLANT
SPONGE INTESTINAL PEANUT (DISPOSABLE) ×6 IMPLANT
SPONGE LAP 18X18 X RAY DECT (DISPOSABLE) ×3 IMPLANT
SPONGE LAP 4X18 X RAY DECT (DISPOSABLE) IMPLANT
SPONGE SURGIFOAM ABS GEL SZ50 (HEMOSTASIS) ×3 IMPLANT
STAPLER VISISTAT 35W (STAPLE) IMPLANT
SUT PDS AB 1 CTX 36 (SUTURE) ×3 IMPLANT
SUT PROLENE 4 0 RB 1 (SUTURE)
SUT PROLENE 4-0 RB1 .5 CRCL 36 (SUTURE) IMPLANT
SUT PROLENE 5 0 CC1 (SUTURE) IMPLANT
SUT PROLENE 6 0 C 1 30 (SUTURE) ×3 IMPLANT
SUT PROLENE 6 0 CC (SUTURE) IMPLANT
SUT SILK 0 TIES 10X30 (SUTURE) ×3 IMPLANT
SUT SILK 2 0 TIES 10X30 (SUTURE) ×3 IMPLANT
SUT SILK 2 0 TIES 17X18 (SUTURE) ×2
SUT SILK 2 0SH CR/8 30 (SUTURE) IMPLANT
SUT SILK 2-0 18XBRD TIE BLK (SUTURE) ×1 IMPLANT
SUT SILK 3 0 TIES 10X30 (SUTURE) ×3 IMPLANT
SUT SILK 3 0SH CR/8 30 (SUTURE) IMPLANT
SUT VIC AB 1 CT1 18XBRD ANBCTR (SUTURE) IMPLANT
SUT VIC AB 1 CT1 8-18 (SUTURE)
SUT VIC AB 2-0 CT1 18 (SUTURE) ×3 IMPLANT
SUT VIC AB 2-0 CT1 36 (SUTURE) IMPLANT
SUT VIC AB 3-0 SH 27 (SUTURE) ×4
SUT VIC AB 3-0 SH 27X BRD (SUTURE) ×2 IMPLANT
SUT VICRYL 4-0 PS2 18IN ABS (SUTURE) IMPLANT
TOWEL OR 17X24 6PK STRL BLUE (TOWEL DISPOSABLE) IMPLANT
TOWEL OR 17X26 10 PK STRL BLUE (TOWEL DISPOSABLE) IMPLANT
TRAY FOLEY W/METER SILVER 16FR (SET/KITS/TRAYS/PACK) ×3 IMPLANT
WATER STERILE IRR 1000ML POUR (IV SOLUTION) ×3 IMPLANT

## 2016-04-02 NOTE — Anesthesia Postprocedure Evaluation (Signed)
Anesthesia Post Note  Patient: Jd Waltz  Procedure(s) Performed: Procedure(s) (LRB): Lumbar five-Sacral one  Anterior lumbar interbody fusion with Dr. Sherren Mocha Early (N/A) ABDOMINAL EXPOSURE (N/A)  Patient location during evaluation: PACU Anesthesia Type: General Level of consciousness: awake and alert, oriented and patient cooperative Pain management: pain level controlled Vital Signs Assessment: post-procedure vital signs reviewed and stable Respiratory status: spontaneous breathing, nonlabored ventilation, respiratory function stable and patient connected to nasal cannula oxygen Cardiovascular status: blood pressure returned to baseline and stable Postop Assessment: no signs of nausea or vomiting Anesthetic complications: no       Last Vitals:  Vitals:   04/02/16 1015 04/02/16 1210  BP:  (!) 148/71  Pulse:  68  Resp:  16  Temp: 36.3 C 36.4 C    Last Pain:  Vitals:   04/02/16 1130  TempSrc:   PainSc: Asleep                 Fiana Gladu,E. Alenna Russell

## 2016-04-02 NOTE — Anesthesia Procedure Notes (Signed)
Procedure Name: Intubation Date/Time: 04/02/2016 7:53 AM Performed by: Mariea Clonts Pre-anesthesia Checklist: Patient identified, Emergency Drugs available, Suction available and Patient being monitored Patient Re-evaluated:Patient Re-evaluated prior to inductionOxygen Delivery Method: Circle System Utilized Preoxygenation: Pre-oxygenation with 100% oxygen Intubation Type: IV induction Ventilation: Mask ventilation without difficulty Laryngoscope Size: Miller and 2 Grade View: Grade II Tube type: Oral Tube size: 7.5 mm Number of attempts: 1 Airway Equipment and Method: Stylet and Oral airway Placement Confirmation: ETT inserted through vocal cords under direct vision,  positive ETCO2 and breath sounds checked- equal and bilateral Tube secured with: Tape Dental Injury: Teeth and Oropharynx as per pre-operative assessment

## 2016-04-02 NOTE — H&P (Signed)
Patient ID:   [id number] Patient: Rebecca Houston  Date of Birth: Oct 25, 1945 Visit Type: Office Visit   Date: 02/27/2016 11:45 AM Provider: Marchia Meiers. Vertell Limber MD   This 71 year old female presents for back pain.  History of Present Illness: 1.  back pain    Rebecca Houston returns for evaluation reporting increasing bilateral buttock pain, worse when walking.  She received nerve root blocks by Dr. Maryjean Ka last year for right hip pain, but noticed new left buttock and left hip pain in December.  Her pain is at an 8/10 at all times.  Injection by Dr. Maryjean Ka late December offered no relief.  He ordered an MRI for Dr. Melven Sartorius review on her visit today.  02/21/16 MRI:  Degenerative disc disease of the lumbar spine with no significant spinal canal stenosis.  Stable moderate right and mild left neural foraminal narrowing at L5-S1  Physical Exam: LE strength is normal, brisk patellar reflexes, and bilateral sciatic notch discomfort.      Medical/Surgical/Interim History Reviewed, no change.  Last detailed document date:11/23/2014.   Family History: Reviewed, no changes.  Last detailed document: 11/23/2014.   Social History: Tobacco use reviewed. Reviewed, no changes. Last detailed document date: 11/23/2014.      MEDICATIONS(added, continued or stopped this visit): Started Medication Directions Instruction Stopped   carvedilol 25 mg tablet take 1 tablet by oral route 2 times every day with food    06/13/2015 gabapentin 100 mg capsule Take 1 po qHS x 3d, then BIDx3d, then TID.     Lexapro 5 mg tablet take 2 tablet by oral route  every day     lisinopril 20 mg tablet take 1 tablet by oral route 2 times every day    04/12/2015 Pennsaid 20 mg/gram/actuation (2 %) topical soln in metered-dose pump Apply 2 pumps to both knees BID     Xarelto 20 mg tablet take 1 tablet by oral route  every day with the evening meal       ALLERGIES:    Vitals Date Temp F BP Pulse Ht In Wt Lb BMI BSA  Pain Score  02/27/2016  158/84 68 67 195 30.54  9/10     DIAGNOSTIC RESULTS    IMPRESSION The patient reports newely developed left buttock and continued back pain with the pain mostly localized in her back.  The pain is exacerbated when she walks.  On review of her X-ray and MRI, there are bulging discs at L3-4 and L4-5 which is causing nerve compression.  There is also a L5-S1 disc degeneration.  On confrontational testing, her LE strength is normal, 3 patellar reflexes, and bilateral sciatic notch discomfort.  I believe surgical intervention is required at this time.   She is currently on xarelto and needs to come off of it in order to under go L5-S1 ALIF.  Patient is on an anti-coagulant, anti-inflammatory or supplement that may increase bleeding time. Patient advised to stop medicine prior to surgery.  Comments:  Will stop Xarelto 1 week pre-op if cleared by Dr Burney Gauze, cardiologist with [facility]  Completed Orders (this encounter) Order Details Reason Side Interpretation Result Initial Treatment Date Region  Lumbar Spine- AP/Lat/Flex/Ex      02/27/2016 All Levels to All Levels  Lifestyle education regarding diet Encouraged patient to eat well balanced diet.        Lifestyle education Patient will follow up with Primary care physician.         Assessment/Plan # Detail Type Description  1. Assessment Lumbar radiculopathy (M54.16).       2. Assessment Body mass index (BMI) 30.0-30.9, adult (Z68.30).   Plan Orders Today's instructions / counseling include(s) Lifestyle education regarding diet.       3. Assessment Essential (primary) hypertension (I10).       4. 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   Plan Orders LSO Brace.       5. Assessment Herniated nucleus pulposus, lumbar (M51.26).         Pain Assessment/Treatment Pain Scale: 9/10. Method: Numeric Pain Intensity Scale. Location: back. Onset: 10/06/2014. Duration: varies. Quality: discomforting. Pain  Assessment/Treatment follow-up plan of care: Patient will continue medication management..  Come off xarelto.  Schedule L5-S1 ALIF.  Nurse education given.  Fit for LSO brace.   Orders: Diagnostic Procedures: Assessment Procedure  M54.16 Lumbar Spine- AP/Lat  M54.16 Lumbar Spine- AP/Lat/Flex/Ex  Instruction(s)/Education: Assessment Instruction  I10 Lifestyle education  Z68.30 Lifestyle education regarding diet  Miscellaneous: Assessment   M99.83 LSO Brace             Provider:  Marchia Meiers. Vertell Limber MD  02/27/2016 05:19 PM Dictation edited by: Daine Gravel    CC Providers: Erline Levine MD [address] [place], [place] [address]              Electronically signed by Marchia Meiers. Vertell Limber MD on 02/29/2016 10:40 AM  Patient ID:   [id number] Patient: Rebecca Houston  Date of Birth: 1945/05/06 Visit Type: Office Visit   Date: 11/23/2014 04:00 PM Provider: Marchia Meiers. Vertell Limber MD   This 71 year old female presents for Leg pain.  History of Present Illness: 1.  Leg pain  Rebecca Houston, 72 year old retired female, visits reporting lumbar right lower extremity pain since August 2016.  Only reported injury is a fall in April resulting in a fractured right hand and facial abrasions and contusions.   History: HTN, melanoma, atrial fib, anxiety Surgical history: 2006 cervical spur, 2009 meningioma, both by Dr. Pamala Hurry  Xarelto 20 mg daily  MRI and x-rays on [facility]  Patient is complaining of low back pain and right hip and leg pain 8 out of 10.  She says that she has pain with walking.  He had an MRI done November 01, 2014.  The patient complains of right hip pain radiating into her shin.  She denies discomfort into her toes or any symptoms on the left.  She has minimal low back pain.  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        PAST MEDICAL/SURGICAL HISTORY    (Detailed)  Disease/disorder Onset Date Management Date Comments    Benign brain tumor removed 2009     Surgery, cervical spine 2006   Anxiety      Arthritis      Atrial fibrillation      Cancer, skin      Hypertension         PAST MEDICAL HISTORY, SURGICAL HISTORY, FAMILY HISTORY, SOCIAL HISTORY AND REVIEW OF SYSTEMS I have reviewed the patient's past medical, surgical, family and social history as well as the comprehensive review of systems as included on the [facility] history form dated 11/23/2014, which I have signed.  Family History  (Detailed) Relationship Family Member Name Deceased Age at Death Condition Onset Age Cause of Death  Father    Dementia  N  Father    Stroke  N  Mother    Myocardial infarction  N    SOCIAL HISTORY  (Detailed) Tobacco use reviewed. Preferred language is Unknown.   Smoking status: Never smoker.  SMOKING STATUS Use Status Type Smoking Status Usage Per Day Years Used Total Pack Years  no/never  Never smoker       HOME ENVIRONMENT/SAFETY  The Patient has fallen 1 times in the last year.        MEDICATIONS(added, continued or stopped this visit): Started Medication Directions Instruction Stopped   carvedilol 25 mg tablet take 1 tablet by oral route 2 times every day with food     lisinopril 20 mg tablet take 1 tablet by oral route 2 times every day     sertraline 100 mg tablet take 2 tablet by oral route  every day     Xarelto 20 mg tablet take 1 tablet by oral route  every day with the evening meal       ALLERGIES: Ingredient Reaction Medication Name Comment  NO KNOWN ALLERGIES     No known allergies.    Vitals Date Temp F BP Pulse Ht In Wt Lb BMI BSA Pain Score  11/23/2014  121/68 68 67 194 30.38  7/10     PHYSICAL EXAM General Level of Distress: no acute distress Overall Appearance: normal    Cardiovascular Cardiac: regular rate and rhythm without murmur  Respiratory Lungs: clear  to auscultation  Neurological Recent and Remote Memory: normal Attention Span and Concentration:   normal Language: normal Fund of Knowledge: normal  Right Left Sensation: normal normal Upper Extremity Coordination: normal normal  Lower Extremity Coordination: normal normal  Musculoskeletal Gait and Station: normal  Right Left Upper Extremity Muscle Strength: normal normal Lower Extremity Muscle Strength: normal normal Upper Extremity Muscle Tone:  normal normal Lower Extremity Muscle Tone: normal normal  Motor Strength Upper and lower extremity motor strength was tested in the clinically pertinent muscles.     Deep Tendon Reflexes  Right Left Biceps: normal normal Triceps: normal normal Brachiloradialis: normal normal Patellar: normal normal Achilles: normal normal  Sensory Sensation was tested at L1 to S1.   Cranial Nerves II. Optic Nerve/Visual Fields: normal III. Oculomotor: normal IV. Trochlear: normal V. Trigeminal: normal VI. Abducens: normal VII. Facial: normal VIII. Acoustic/Vestibular: normal IX. Glossopharyngeal: normal X. Vagus: normal XI. Spinal Accessory: normal XII. Hypoglossal: normal  Motor and other Tests Lhermittes: negative Rhomberg: negative    Right Left Hoffman's: normal normal Clonus: normal normal Babinski: normal normal SLR: positive at 40 degrees negative Patrick's Corky Sox): negative negative Toe Walk: normal normal Toe Lift: normal normal Heel Walk: normal normal SI Joint: nontender nontender   Additional Findings:  Right sciatic notch discomfort to palpation.  Patient is able to bend to within 12 inches of the floor with her upper extremities outstretched.  She is able stand on her heels and toes.    IMPRESSION The patient has a right S1 radiculopathy.  She does not appear to have weakness.  I recommended that she have a right S1 selective nerve root block.  The patient will follow up with me for recheck after that  has been performed.  Completed Orders (this encounter) Order Details Reason Side Interpretation Result Initial Treatment Date Region  Lifestyle education regarding diet Encouraged to eat a well balanced diet and follow up with primary care physician.         Assessment/Plan # Detail Type Description   1. Assessment Disc displacement, lumbar (M51.26).  2. Assessment Low back pain, unspecified back pain laterality, with sciatica presence unspecified (M54.5).       3. Assessment Radiculopathy, lumbosacral region (M54.17).       4. 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       5. Assessment Body mass index (BMI) 30.0-30.9, adult (Z68.30).   Plan Orders Today's instructions / counseling include(s) Lifestyle education regarding diet.       6. Assessment Lumbar radiculopathy (M54.16).         Pain Assessment/Treatment Pain Scale: 7/10. Method: Numeric Pain Intensity Scale. Location: right leg. Onset: 10/06/2014. Duration: varies. Quality: discomforting. Pain Assessment/Treatment follow-up plan of care: Patient is taking medications as prescribed..  Fall Risk Plan The Patient has fallen 1 times in the last year.  Falls risk follow-up plan of care: Assisted devices: Advised patient to use handrails..  Right S1 block and follow up with me.  Orders: Diagnostic Procedures: Assessment Procedure  M54.17 TFESI - right - S1 Harkins  M54.5 Lumbar Spine- AP/Lat/Flex/Ex  Instruction(s)/Education: Assessment Instruction  Z68.30 Lifestyle education regarding diet             Provider:  Marchia Meiers. Vertell Limber MD  11/27/2014 04:40 PM Dictation edited by: Marchia Meiers. Vertell Limber    CC Providers: Erline Levine MD [address] [place], [place] [address]              Electronically signed by Marchia Meiers. Vertell Limber MD on 11/27/2014 04:40 PM

## 2016-04-02 NOTE — Anesthesia Preprocedure Evaluation (Addendum)
Anesthesia Evaluation  Patient identified by MRN, date of birth, ID band Patient awake    Reviewed: Allergy & Precautions, NPO status , Patient's Chart, lab work & pertinent test results  History of Anesthesia Complications (+) PONV  Airway Mallampati: II  TM Distance: >3 FB Neck ROM: Full    Dental  (+) Caps, Dental Advisory Given   Pulmonary former smoker,    breath sounds clear to auscultation       Cardiovascular hypertension, Pt. on home beta blockers and Pt. on medications (-) angina+ dysrhythmias (paroxysmal) Atrial Fibrillation  Rhythm:Regular Rate:Normal     Neuro/Psych negative neurological ROS     GI/Hepatic Neg liver ROS, GERD  Medicated and Controlled,  Endo/Other  Morbid obesity  Renal/GU negative Renal ROS     Musculoskeletal  (+) Arthritis , Osteoarthritis,    Abdominal (+) + obese,   Peds  Hematology negative hematology ROS (+)   Anesthesia Other Findings   Reproductive/Obstetrics                            Anesthesia Physical Anesthesia Plan  ASA: III  Anesthesia Plan: General   Post-op Pain Management:    Induction: Intravenous  Airway Management Planned: Oral ETT  Additional Equipment:   Intra-op Plan:   Post-operative Plan: Extubation in OR  Informed Consent: I have reviewed the patients History and Physical, chart, labs and discussed the procedure including the risks, benefits and alternatives for the proposed anesthesia with the patient or authorized representative who has indicated his/her understanding and acceptance.   Dental advisory given  Plan Discussed with: CRNA and Surgeon  Anesthesia Plan Comments: (Plan routine monitors, GETA )        Anesthesia Quick Evaluation

## 2016-04-02 NOTE — Op Note (Signed)
04/02/2016  9:57 AM  PATIENT:  Rebecca Houston  71 y.o. female  PRE-OPERATIVE DIAGNOSIS:  Lumbar Foraminal Stenosis, lumbosacral disc degeneration, herniated lumbar disc, lumbago, radiculopathy L 5 S 1 level  POST-OPERATIVE DIAGNOSIS: Lumbar Foraminal Stenosis, lumbosacral disc degeneration, herniated lumbar disc, lumbago, radiculopathy L 5 S 1 level  PROCEDURE:  Procedure(s) with comments: Lumbar five-Sacral one  Anterior lumbar interbody fusion with Dr. Sherren Mocha Early (N/A) - L5-S1 Anterior lumbar interbody fusion with Dr. Sherren Mocha Early ABDOMINAL EXPOSURE (N/A)  SURGEON:  Surgeon(s) and Role: Panel 1:    * Erline Levine, MD - Primary  Panel 2:    * Rosetta Posner, MD - Primary  PHYSICIAN ASSISTANT:   ASSISTANTS: Poteat, RN   ANESTHESIA:   general  EBL:  Total I/O In: 1500 [I.V.:1500] Out: 250 [Urine:150; Blood:100]  BLOOD ADMINISTERED:none  DRAINS: none   LOCAL MEDICATIONS USED:  MARCAINE    and LIDOCAINE   SPECIMEN:  No Specimen  DISPOSITION OF SPECIMEN:  N/A  COUNTS:  YES  TOURNIQUET:  * No tourniquets in log *  DICTATION:    INDICATIONS:  Pateint is 71 year old female with chronic and intractable back and bilateral lower extremity pain.  She had received a series of injections, which helped control her pain for some time, but these ceased to be effective.  It was elected to take her to surgery for anterior lumbar decompression and fusion at the L 5 S1 level.   PROCEDURE:  Doctor Early performed exposure and his portion of the procedure will be dictated separately.  Upon exposing the L 5 S1 level, a localizing X ray was obtained with the C arm.  I then incised the anterior annulus and performed a thorough discectomy with wide ligamentous releases.  The endplates were cleared of disc and cartilagenous material and a thorough discectomy was performed with decompression of the ventral annulus and disc material.  After trials, a 15 degree, 8 x 38 x 28 Base titanium ALIF cage was  placed and lagged with 3, 5.0 x 20 mm screws in S 1 and one 22.5 mm screw in L 5. This spacer which was  packed with extra extra small  BMP and Attrax.  The implant was tamped into position and positioning was confirmed with C arm.   Locking mechanisms were engaged, soft tissues were inspected and found to be in good repair. Fascia was closed with 1 PDS running stitch, skin edges closed with 2-0 and 3-0 vicryl sutures.  Wound was dressed with a sterile occlusive dressing.  Counts were correct at the end of the case.  Patient was extubated in the OR and taken to recovery having tolerated her surgery well.    PLAN OF CARE: Admit to inpatient   PATIENT DISPOSITION:  PACU - hemodynamically stable.   Delay start of Pharmacological VTE agent (>24hrs) due to surgical blood loss or risk of bleeding: yes

## 2016-04-02 NOTE — Interval H&P Note (Signed)
History and Physical Interval Note:  04/02/2016 7:35 AM  Rebecca Houston  has presented today for surgery, with the diagnosis of Lumbar Foraminal Stenosis  The various methods of treatment have been discussed with the patient and family. After consideration of risks, benefits and other options for treatment, the patient has consented to  Procedure(s) with comments: L5-S1 Anterior lumbar interbody fusion with Dr. Sherren Mocha Early (N/A) - L5-S1 Anterior lumbar interbody fusion with Dr. Sherren Mocha Early [place] (N/A) as a surgical intervention .  The patient's history has been reviewed, patient examined, no change in status, stable for surgery.  I have reviewed the patient's chart and labs.  Questions were answered to the patient's satisfaction.     Deaja Rizo D

## 2016-04-02 NOTE — Op Note (Signed)
    OPERATIVE REPORT  DATE OF SURGERY: 04/02/2016  PATIENT: Rebecca Houston, 71 y.o. female MRN: [id number]  DOB: 05-19-1945  PRE-OPERATIVE DIAGNOSIS: Degenerative disc disease  POST-OPERATIVE DIAGNOSIS:  Same  PROCEDURE: Anterior exposure for L5-S1 disc surgery  SURGEON:  Curt Jews, M.D.  Co-surgeon for the exposure Dr. Vertell Limber PHYSICIAN ASSISTANT: Humberto Seals  ANESTHESIA:  Gen.  EBL: 100 ml  Total I/O In: 1800 [I.V.:1800] Out: 750 [Urine:650; Blood:100]  BLOOD ADMINISTERED: None  DRAINS: None  SPECIMEN: None  COUNTS CORRECT:  YES  PLAN OF CARE: PACU   PATIENT DISPOSITION:  PACU - hemodynamically stable  PROCEDURE DETAILS: The patient was taken to cath and placed supine position where the area the abdomen was prepped and draped in usual sterile fashion. Crosstable lateral C-arm projection reveal the level of the L5-S1 disc and relation to the abdominal wall. An incision was made from the midline to the left over this area several centimeters below the umbilicus. The subcutaneous fat was opened in line with skin incision with left cautery. The fat was mobilized off the anterior rectus sheath. The anterior rectus sheath was opened in line with skin incision as well. The rectus muscle was mobilized circumferentially. The risks peritoneal space was entered and the left lower quadrant and the risks peritoneal contents were mobilized to the right. The posterior rectus sheath was opened laterally for better exposure. The ureter and intra-abdominal contents were mobilized to the right. The dissection was continued above the level of the psoas muscle to the iliac vessels. Dissection over the L5-S1 disc was continued to give mobilization for discectomy and fusion. Middle sacral vessels were clipped with ligaclips and divided. Again the catheter dissection or was used to mobilize superiorly and inferiorly. The Thompson retractor was brought onto the field and the reverse lip 150 blades  were positioned to the right and left of the L5-S1 disc. The 190s malleable retractors were used for superior and inferior exposure. A needle was placed in the L5-S1 disc and C-arm was brought back onto the field to confirm that this was the L5-S1 disc level. The remainder the procedure will be dictated as a separate note by Dr. Gari Crown, M.D., [facility] 04/02/2016 2:53 PM

## 2016-04-02 NOTE — Transfer of Care (Signed)
Immediate Anesthesia Transfer of Care Note  Patient: Rebecca Houston  Procedure(s) Performed: Procedure(s) with comments: Lumbar five-Sacral one  Anterior lumbar interbody fusion with Dr. Sherren Mocha Early (N/A) - L5-S1 Anterior lumbar interbody fusion with Dr. Sherren Mocha Early ABDOMINAL EXPOSURE (N/A)  Patient Location: PACU  Anesthesia Type:General  Level of Consciousness: awake, alert  and oriented  Airway & Oxygen Therapy: Patient Spontanous Breathing and Patient connected to nasal cannula oxygen  Post-op Assessment: Report given to RN and Post -op Vital signs reviewed and stable  Post vital signs: Reviewed and stable  Last Vitals:  Vitals:   04/02/16 0619  BP: (!) 174/76  Pulse: 64  Resp: 18  Temp: 37.2 C    Last Pain:  Vitals:   04/02/16 0620  TempSrc:   PainSc: 0-No pain      Patients Stated Pain Goal: 2 ([id number])  Complications: No apparent anesthesia complications

## 2016-04-02 NOTE — Brief Op Note (Signed)
04/02/2016  9:57 AM  PATIENT:  Rebecca Houston  71 y.o. female  PRE-OPERATIVE DIAGNOSIS:  Lumbar Foraminal Stenosis, lumbosacral disc degeneration, herniated lumbar disc, lumbago, radiculopathy L 5 S 1 level  POST-OPERATIVE DIAGNOSIS: Lumbar Foraminal Stenosis, lumbosacral disc degeneration, herniated lumbar disc, lumbago, radiculopathy L 5 S 1 level  PROCEDURE:  Procedure(s) with comments: Lumbar five-Sacral one  Anterior lumbar interbody fusion with Dr. Sherren Mocha Early (N/A) - L5-S1 Anterior lumbar interbody fusion with Dr. Sherren Mocha Early ABDOMINAL EXPOSURE (N/A)  SURGEON:  Surgeon(s) and Role: Panel 1:    * Erline Levine, MD - Primary  Panel 2:    * Rosetta Posner, MD - Primary  PHYSICIAN ASSISTANT:   ASSISTANTS: Poteat, RN   ANESTHESIA:   general  EBL:  Total I/O In: 1500 [I.V.:1500] Out: 250 [Urine:150; Blood:100]  BLOOD ADMINISTERED:none  DRAINS: none   LOCAL MEDICATIONS USED:  MARCAINE    and LIDOCAINE   SPECIMEN:  No Specimen  DISPOSITION OF SPECIMEN:  N/A  COUNTS:  YES  TOURNIQUET:  * No tourniquets in log *  DICTATION: DICTATION:   INDICATIONS:  Pateint is 71 year old female with chronic and intractable back and bilateral lower extremity pain.  She had received a series of injections, which helped control her pain for some time, but these ceased to be effective.  It was elected to take her to surgery for anterior lumbar decompression and fusion at the L 5 S1 level.   PROCEDURE:  Doctor Early performed exposure and his portion of the procedure will be dictated separately.  Upon exposing the L 5 S1 level, a localizing X ray was obtained with the C arm.  I then incised the anterior annulus and performed a thorough discectomy with wide ligamentous releases.  The endplates were cleared of disc and cartilagenous material and a thorough discectomy was performed with decompression of the ventral annulus and disc material.  After trials, a 15 degree, 8 x 38 x 28 Base titanium ALIF  cage was placed and lagged with 3, 5.0 x 20 mm screws in S 1 and one 22.5 mm screw in L 5. This spacer which was  packed with extra extra small  BMP and Attrax.  The implant was tamped into position and positioning was confirmed with C arm.   Locking mechanisms were engaged, soft tissues were inspected and found to be in good repair. Fascia was closed with 1 PDS running stitch, skin edges closed with 2-0 and 3-0 vicryl sutures.  Wound was dressed with a sterile occlusive dressing.  Counts were correct at the end of the case.  Patient was extubated in the OR and taken to recovery having tolerated her surgery well.    PLAN OF CARE: Admit to inpatient   PATIENT DISPOSITION:  PACU - hemodynamically stable.   Delay start of Pharmacological VTE agent (>24hrs) due to surgical blood loss or risk of bleeding: yes

## 2016-04-03 ENCOUNTER — Encounter (HOSPITAL_COMMUNITY): Payer: Self-pay | Admitting: Neurosurgery

## 2016-04-03 NOTE — Progress Notes (Addendum)
Subjective: Patient reports "I don't hurt. I just had one little episode of nausea, but this is my first surgery I didnt get sick"  Objective: Vital signs in last 24 hours: Temp:  [97.4 F (36.3 C)-99 F (37.2 C)] 98.3 F (36.8 C) (02/20 0430) Pulse Rate:  [58-68] 62 (02/20 0430) Resp:  [16-18] 18 (02/20 0430) BP: (121-162)/(53-77) 140/53 (02/20 0430) SpO2:  [97 %-99 %] 99 % (02/20 0430)  Intake/Output from previous day: 02/19 0701 - 02/20 0700 In: 2400 [P.O.:600; I.V.:1800] Out: 750 [Urine:650; Blood:100] Intake/Output this shift: No intake/output data recorded.  Alert, smiling. Husband present. Up x3 since yesterday, without pain. Passing gas, but no BM yet. Belly soft, nontender. Incision without erythema, swelling, or drainage beneath Dermabond and honeycomb drsg. Good strength BLE. Right shin skin tear (pre-hosp injury) healing nicely.   Lab Results: No results for input(s): WBC, HGB, HCT, PLT in the last 72 hours. BMET No results for input(s): NA, K, CL, CO2, GLUCOSE, BUN, CREATININE, CALCIUM in the last 72 hours.  Studies/Results: Dg Lumbar Spine 2-3 Views  Result Date: 04/02/2016 CLINICAL DATA:  Lumbar spine stenosis with neurogenic claudication. Lumbar spine fusion. EXAM: DG C-ARM 61-120 MIN; LUMBAR SPINE - 2-3 VIEW COMPARISON:  None. FINDINGS: AP and lateral views show ALIF hardware in expected position at L5-S1. IMPRESSION: ALIF hardware placement at L5-S1. Electronically Signed   By: Earle Gell M.D.   On: 04/02/2016 10:26   Dg C-arm 1-60 Min  Result Date: 04/02/2016 CLINICAL DATA:  Lumbar spine stenosis with neurogenic claudication. Lumbar spine fusion. EXAM: DG C-ARM 61-120 MIN; LUMBAR SPINE - 2-3 VIEW COMPARISON:  None. FINDINGS: AP and lateral views show ALIF hardware in expected position at L5-S1. IMPRESSION: ALIF hardware placement at L5-S1. Electronically Signed   By: Earle Gell M.D.   On: 04/02/2016 10:26   Dg Or Local Abdomen  Result Date:  04/02/2016 CLINICAL DATA:  Anterior fusion.  No instrument count EXAM: OR LOCAL ABDOMEN COMPARISON:  None. FINDINGS: There is postoperative change at L5-S1. Bowel gas pattern is normal. No retained needle or instrument evident. IMPRESSION: Postoperative change L5-S1. Normal gas pattern. No retained instrument or needle. Critical Value/emergent results were called by telephone at the time of interpretation on 04/02/2016 at 9:57 am to OR circulating nurse, who verbally acknowledged these results. Electronically Signed   By: Lowella Grip III M.D.   On: 04/02/2016 09:57    Assessment/Plan: Improving  LOS: 1 day  Mobilize in brace, work with PT today, monitoring bowels. Ok per Dr. Donnetta Hutching to use Bacitracin cream or ointment to right shin skin tear today & cover with Telfa.    Verdis Prime 04/03/2016, 7:25 AM   Patient is doing well.  Mobilize with PT today.

## 2016-04-03 NOTE — Evaluation (Signed)
Physical Therapy Evaluation Patient Details Name: Rebecca Houston MRN: [id number] DOB: April 19, 1945 Today's Date: 04/03/2016   History of Present Illness  Pt is a 71 y/o female s/p L5-S1 fusion. PMH includes a fib, HTN, arthritis, smoker, neck surgery, and menegioma s/p surgery.   Clinical Impression  Patient is s/p above surgery resulting in the deficits listed below (see PT Problem List). Prior to admission, pt was independent with functional mobility tasks. Upon evaluation, pt demonstrated generalized BLE weakness and decreased balance resulting in need for assist for gait and other mobility tasks. Pt able to recall back precautions without cuing. Patient will benefit from skilled PT to increase their independence and safety with mobility (while adhering to their precautions) to allow discharge to the venue listed below.     Follow Up Recommendations No PT follow up;Supervision/Assistance - 24 hour    Equipment Recommendations  Rolling walker with 5" wheels    Recommendations for Other Services       Precautions / Restrictions Precautions Precautions: Back Precaution Booklet Issued: Yes (comment) Precaution Comments: OT administered and reviewed handout. Pt able to recall 3/3 back precautions.  Restrictions Weight Bearing Restrictions: No      Mobility  Bed Mobility Overal bed mobility: Needs Assistance Bed Mobility: Supine to Sit     Supine to sit: Mod assist     General bed mobility comments: Pt in chair upon entry.   Transfers Overall transfer level: Needs assistance Equipment used: Rolling walker (2 wheeled) Transfers: Sit to/from Stand Sit to Stand: Supervision         General transfer comment: Given cues for sequencing during transfer and scooting to edge of chair before standing.   Ambulation/Gait Ambulation/Gait assistance: Supervision;Min guard Ambulation Distance (Feet): 150 Feet Assistive device: Rolling walker (2 wheeled) Gait Pattern/deviations:  Decreased stride length;Wide base of support Gait velocity: Decreased Gait velocity interpretation: Below normal speed for age/gender General Gait Details: Pt required RW secondary to generalized weakness consistent with pre-op diagnosis.   Stairs Stairs: Yes Stairs assistance: Min assist Stair Management: Two rails;Step to pattern;Forwards Number of Stairs: 2 General stair comments: Education about use of rails and step-to gait pattern. Required min A secondary to decreased balance.   Wheelchair Mobility    Modified Rankin (Stroke Patients Only)       Balance Overall balance assessment: Needs assistance Sitting-balance support: Feet supported Sitting balance-Leahy Scale: Fair     Standing balance support: Bilateral upper extremity supported;During functional activity Standing balance-Leahy Scale: Poor Standing balance comment: Required use of UE on RW for balance.                              Pertinent Vitals/Pain Pain Assessment: Faces Faces Pain Scale: Hurts a little bit Pain Location: back Pain Descriptors / Indicators: Sore;Operative site guarding Pain Intervention(s): Limited activity within patient's tolerance;Monitored during session;Repositioned    Home Living Family/patient expects to be discharged to:: Private residence Living Arrangements: Spouse/significant other;Children;Other relatives Available Help at Discharge: Family;Available 24 hours/day Type of Home: House Home Access: Stairs to enter Entrance Stairs-Rails: Psychiatric nurse of Steps: 2 Home Layout: Two level;Able to live on main level with bedroom/bathroom Home Equipment: Walker - 4 wheels;Shower seat Additional Comments: Unsure if she has standard RW.     Prior Function Level of Independence: Independent               Hand Dominance   Dominant Hand: Right    Extremity/Trunk  Assessment   Upper Extremity Assessment Upper Extremity Assessment: Defer to  OT evaluation    Lower Extremity Assessment Lower Extremity Assessment: Generalized weakness (consistent with preop diagnosis. )    Cervical / Trunk Assessment Cervical / Trunk Assessment: Other exceptions Cervical / Trunk Exceptions: s/p surg  Communication   Communication: No difficulties  Cognition Arousal/Alertness: Awake/alert Behavior During Therapy: WFL for tasks assessed/performed Overall Cognitive Status: Within Functional Limits for tasks assessed                      General Comments General comments (skin integrity, edema, etc.): Husband present throughout session. Education provided about frequency/duration of walking exercise program.     Exercises     Assessment/Plan    PT Assessment Patient needs continued PT services  PT Problem List Decreased strength;Decreased mobility;Decreased activity tolerance;Decreased balance;Decreased knowledge of use of DME;Decreased knowledge of precautions;Pain       PT Treatment Interventions DME instruction;Gait training;Stair training;Functional mobility training;Therapeutic activities;Therapeutic exercise;Neuromuscular re-education;Patient/family education    PT Goals (Current goals can be found in the Care Plan section)  Acute Rehab PT Goals Patient Stated Goal: to return home PT Goal Formulation: With patient/family Time For Goal Achievement: 04/10/16 Potential to Achieve Goals: Good    Frequency Min 5X/week   Barriers to discharge        Co-evaluation               End of Session Equipment Utilized During Treatment: Gait belt;Back brace Activity Tolerance: Patient tolerated treatment well Patient left: in chair;with call bell/phone within reach;with family/visitor present Nurse Communication: Mobility status PT Visit Diagnosis: Unsteadiness on feet (R26.81);Muscle weakness (generalized) (M62.81)         Time: [id number] PT Time Calculation (min) (ACUTE ONLY): 15 min   Charges:   PT  Evaluation $PT Eval Low Complexity: 1 Procedure     PT G Codes:         Rebecca Houston 04/03/2016, 8:56 AM  Rebecca Houston, PT, DPT  Acute Rehabilitation Services  Pager: [phone]

## 2016-04-03 NOTE — Progress Notes (Signed)
Patient ID: Rebecca Houston, female   DOB: 1945/08/17, 71 y.o.   MRN: [id number] Looks great. Comfortable. Had one episode of nausea last night. This has resolved. 2+ dorsalis pedis pulses bilaterally. Abdomen soft.  Will not follow actively. Please call if we can assist

## 2016-04-03 NOTE — Progress Notes (Signed)
Occupational Therapy Evaluation Patient Details Name: Rebecca Houston MRN: [id number] DOB: September 20, 1945 Today's Date: 04/03/2016    History of Present Illness 71 yo female s/p L5-s1 fusion  Past Medical History:  Diagnosis Date  . A-fib ([place])   . Anxiety   . Arthritis   . Brain tumor (benign) ([place])   . Bruises easily   . Cancer ([place])    melanoma on neck  . Depression   . Heartburn   . History of kidney stones   . Hypertension   . Lumbar foraminal stenosis    L5-S1  . PONV (postoperative nausea and vomiting)   . Renal cyst       Clinical Impression   Patient evaluated by Occupational Therapy with no further acute OT needs identified. All education has been completed and the patient has no further questions. See below for any follow-up Occupational Therapy or equipment needs. OT to sign off. Thank you for referral.      Follow Up Recommendations  No OT follow up    Equipment Recommendations  None recommended by OT    Recommendations for Other Services       Precautions / Restrictions Precautions Precautions: Back Precaution Comments: back handout provided and reviewed in detail for adls.      Mobility Bed Mobility Overal bed mobility: Needs Assistance Bed Mobility: Supine to Sit     Supine to sit: Mod assist     General bed mobility comments: HOB flat and educated on bed mobility sequence  Transfers Overall transfer level: Needs assistance Equipment used: Rolling walker (2 wheeled) Transfers: Sit to/from Stand Sit to Stand: Supervision              Balance                                            ADL Overall ADL's : Needs assistance/impaired Eating/Feeding: Independent   Grooming: Wash/dry hands;Wash/dry face;Supervision/safety       Lower Body Bathing: Minimal assistance           Toilet Transfer: Supervision/safety       Tub/ Shower Transfer: Walk-in shower;Supervision/safety   Functional mobility during  ADLs: Supervision/safety;Rolling walker General ADL Comments: pt able to cross L LE and plans to have spouse (A) with dressing. pt reports daughter will help her upon d/c.   Back handout provided and reviewed adls in detail. Pt educated on: clothing between brace, never sleep in brace, set an alarm at night for medication, avoid sitting for long periods of time, correct bed positioning for sleeping, correct sequence for bed mobility, avoiding lifting more than 5 pounds and never wash directly over incision. Pt able to don doff brace.  All education is complete and patient indicates understanding.     Vision Baseline Vision/History: Wears glasses Wears Glasses: Reading only Patient Visual Report: No change from baseline       Perception     Praxis      Pertinent Vitals/Pain Pain Assessment: Faces Faces Pain Scale: Hurts a little bit Pain Location: back Pain Descriptors / Indicators: Sore Pain Intervention(s): Monitored during session;Premedicated before session;Repositioned     Hand Dominance Right   Extremity/Trunk Assessment Upper Extremity Assessment Upper Extremity Assessment: Overall WFL for tasks assessed   Lower Extremity Assessment Lower Extremity Assessment: Defer to PT evaluation   Cervical / Trunk Assessment Cervical / Trunk Assessment: Other exceptions  Cervical / Trunk Exceptions: s/p surg   Communication Communication Communication: No difficulties   Cognition Arousal/Alertness: Awake/alert Behavior During Therapy: WFL for tasks assessed/performed Overall Cognitive Status: Within Functional Limits for tasks assessed                     General Comments       Exercises       Shoulder Instructions      Home Living Family/patient expects to be discharged to:: Private residence Living Arrangements: Spouse/significant other;Children;Other relatives Available Help at Discharge: Family;Available 24 hours/day Type of Home: House Home Access:  Stairs to enter [employer] of Steps: 2 Entrance Stairs-Rails: Right;Left Home Layout: Two level;Able to live on main level with bedroom/bathroom Alternate Level Stairs-Number of Steps: daughter grandchild and son in law live in basement at this time. Dgter is an Therapist, sports.    Bathroom Shower/Tub: Occupational psychologist: Handicapped height     Home Equipment: Environmental consultant - 2 wheels;Walker - 4 wheels;Shower seat          Prior Functioning/Environment Level of Independence: Independent                 OT Problem List: Decreased strength;Decreased activity tolerance;Impaired balance (sitting and/or standing);Decreased safety awareness;Decreased knowledge of use of DME or AE;Decreased knowledge of precautions;Pain      OT Treatment/Interventions:      OT Goals(Current goals can be found in the care plan section) Acute Rehab OT Goals Patient Stated Goal: to return home OT Goal Formulation: With patient  OT Frequency:     Barriers to D/C:            Co-evaluation              End of Session Equipment Utilized During Treatment: Gait belt;Rolling walker;Back brace Nurse Communication: Mobility status;Precautions  Activity Tolerance: Patient tolerated treatment well Patient left: in chair;with call bell/phone within reach  OT Visit Diagnosis: Muscle weakness (generalized) (M62.81)                ADL either performed or assessed with clinical judgement  Time: [id number] OT Time Calculation (min): 22 min Charges:  OT General Charges $OT Visit: 1 Procedure OT Evaluation $OT Eval Moderate Complexity: 1 Procedure G-Codes:      Jeri Modena   OTR/L [phone] Office: [phone] .   Parke Poisson B 04/03/2016, 8:11 AM

## 2016-04-04 NOTE — Discharge Summary (Signed)
Physician Discharge Summary  Patient ID: Rebecca Houston MRN: [id number] DOB/AGE: 1946-02-02 71 y.o.  Admit date: 04/02/2016 Discharge date: 04/04/2016  Admission Diagnoses:Lumbar Foraminal Stenosis, lumbosacral disc degeneration, herniated lumbar disc, lumbago, radiculopathy L 5 S 1 level    Discharge Diagnoses: Lumbar Foraminal Stenosis, lumbosacral disc degeneration, herniated lumbar disc, lumbago, radiculopathy L 5 S 1 level s/p Lumbar five-Sacral one Anterior lumbar interbody fusion with Dr. Sherren Mocha Early (N/A) - L5-S1 Anterior lumbar interbody fusion with Dr. Sherren Mocha Early ABDOMINAL EXPOSURE (N/A)   Active Problems:   Lumbar stenosis with neurogenic claudication   Discharged Condition: good  Hospital Course: Rebecca Houston was admitted for surgery with foraminal stenosis and radiculopathy. Following uncomplicated ALIF [id number], she recovered nicely and transferred to [facility] for nursing care and therapies. She is mobilizing well.   Consults: vascular surgery  Significant Diagnostic Studies: radiology: X-Ray: intra-op  Treatments: surgery: Lumbar five-Sacral one Anterior lumbar interbody fusion with Dr. Sherren Mocha Early (N/A) - L5-S1 Anterior lumbar interbody fusion with Dr. Sherren Mocha Early ABDOMINAL EXPOSURE (N/A)    Discharge Exam: Blood pressure (!) 148/76, pulse 66, temperature 98.1 F (36.7 C), temperature source Oral, resp. rate 18, weight 89.4 kg (197 lb), SpO2 98 %. Alert, conversant. Reports no further nausea, no lumbar or hip pain. Some left abdomen incisional pain overnight. Incision without erythema, swelling, or drainage. Belly slightly distended but nontender. No BM yet; but she is adamant re: leaving this morning. (Her husband was involved in an accident upon leaving her yesterday & was transported to [place] last night.)    Disposition: Discharge to home. Rolling walker for home use. Norco & Tizanidine will be eRx'ed.  Pt verbalizes understanding of d/c instructions  and agrees to call office to schedule 3-4 week f/u. She verbalizes understanding of ileus s/s and will begin Dulcolax suppository daily when she arrives home.     Discharge Instructions    Diet - low sodium heart healthy    Complete by:  As directed    Increase activity slowly    Complete by:  As directed      Allergies as of 04/04/2016      Reactions   No Known Allergies       Medication List    TAKE these medications   allantoin gel Apply topically as needed for wound care. Thin application over wound and cover with dry dressing wrapped around leg.   ALPRAZolam 0.5 MG tablet Commonly known as:  XANAX Take 0.5 mg by mouth 2 (two) times daily as needed for anxiety.   carvedilol 12.5 MG tablet Commonly known as:  COREG Take 12.5 mg by mouth 2 (two) times daily with a meal.   cephALEXin 500 MG capsule Commonly known as:  KEFLEX Take 500 mg by mouth 3 (three) times daily. 10 day course started 03-12-2016 for wound on leg   escitalopram 20 MG tablet Commonly known as:  LEXAPRO Take 20 mg by mouth every evening.   HYDROcodone-acetaminophen 5-325 MG tablet Commonly known as:  NORCO/VICODIN Take 1 tablet by mouth every 4 (four) hours as needed for moderate pain.   lisinopril 20 MG tablet Commonly known as:  PRINIVIL,ZESTRIL Take 20 mg by mouth 2 (two) times daily.   mupirocin ointment 2 % Commonly known as:  BACTROBAN Place 1 application into the nose daily as needed (wound care).   omeprazole 20 MG capsule Commonly known as:  PRILOSEC Take 20 mg by mouth every other day.   rivaroxaban 20 MG Tabs tablet Commonly known as:  XARELTO Take 20 mg by mouth daily with supper.        Signed: Peggyann Shoals, MD 04/04/2016, 8:49 AM

## 2016-04-04 NOTE — Progress Notes (Signed)
Patient alert and oriented, mae's well, voiding adequate amount of urine, swallowing without difficulty, no c/o pain. Patient discharged home with family. Script and discharged instructions given to patient. Patient and family stated understanding of d/c instructions given and has an appointment with Dr. Stern  

## 2016-04-04 NOTE — Progress Notes (Addendum)
Subjective: Patient reports "I had some pain at my incision, but my back still doesn't hurt"  Objective: Vital signs in last 24 hours: Temp:  [98.1 F (36.7 C)-99 F (37.2 C)] 98.1 F (36.7 C) (02/21 0318) Pulse Rate:  [59-80] 66 (02/21 0318) Resp:  [18] 18 (02/21 0318) BP: (120-148)/(62-76) 148/76 (02/21 0318) SpO2:  [97 %-100 %] 98 % (02/21 0318)  Intake/Output from previous day: 02/20 0701 - 02/21 0700 In: 1140 [P.O.:1140] Out: -  Intake/Output this shift: No intake/output data recorded.  Alert, conversant. Reports no further nausea, no lumbar or hip pain. Some left abdomen incisional pain overnight. Incision without erythema, swelling, or drainage. Belly slightly distended but nontender. No BM yet; but she is adamant re: leaving this morning. (Her husband was involved in an accident upon leaving her yesterday & was transported to [facility] last night.)  Lab Results: No results for input(s): WBC, HGB, HCT, PLT in the last 72 hours. BMET No results for input(s): NA, K, CL, CO2, GLUCOSE, BUN, CREATININE, CALCIUM in the last 72 hours.  Studies/Results: Dg Lumbar Spine 2-3 Views  Result Date: 04/02/2016 CLINICAL DATA:  Lumbar spine stenosis with neurogenic claudication. Lumbar spine fusion. EXAM: DG C-ARM 61-120 MIN; LUMBAR SPINE - 2-3 VIEW COMPARISON:  None. FINDINGS: AP and lateral views show ALIF hardware in expected position at L5-S1. IMPRESSION: ALIF hardware placement at L5-S1. Electronically Signed   By: Earle Gell M.D.   On: 04/02/2016 10:26   Dg C-arm 1-60 Min  Result Date: 04/02/2016 CLINICAL DATA:  Lumbar spine stenosis with neurogenic claudication. Lumbar spine fusion. EXAM: DG C-ARM 61-120 MIN; LUMBAR SPINE - 2-3 VIEW COMPARISON:  None. FINDINGS: AP and lateral views show ALIF hardware in expected position at L5-S1. IMPRESSION: ALIF hardware placement at L5-S1. Electronically Signed   By: Earle Gell M.D.   On: 04/02/2016 10:26   Dg Or Local Abdomen  Result Date:  04/02/2016 CLINICAL DATA:  Anterior fusion.  No instrument count EXAM: OR LOCAL ABDOMEN COMPARISON:  None. FINDINGS: There is postoperative change at L5-S1. Bowel gas pattern is normal. No retained needle or instrument evident. IMPRESSION: Postoperative change L5-S1. Normal gas pattern. No retained instrument or needle. Critical Value/emergent results were called by telephone at the time of interpretation on 04/02/2016 at 9:57 am to OR circulating nurse, who verbally acknowledged these results. Electronically Signed   By: Lowella Grip III M.D.   On: 04/02/2016 09:57    Assessment/Plan: Improving  LOS: 2 days  Ok per DrStern to d/c home. Norco & Tizanidine will be eRx'ed. Pt verbalizes understanding of d/c instructions and agrees to call office to schedule 3-4 week f/u. She verbalizes understanding of ileus s/s and will begin Dulcolax suppository daily when she arrives home.    Verdis Prime 04/04/2016, 7:19 AM  Discharge home.  Doing well.

## 2016-06-01 ENCOUNTER — Other Ambulatory Visit: Payer: Self-pay

## 2016-06-01 DIAGNOSIS — M25471 Effusion, right ankle: Secondary | ICD-10-CM

## 2016-06-01 DIAGNOSIS — M25472 Effusion, left ankle: Principal | ICD-10-CM

## 2016-06-29 ENCOUNTER — Encounter: Payer: Self-pay | Admitting: Vascular Surgery

## 2016-07-11 ENCOUNTER — Ambulatory Visit (HOSPITAL_COMMUNITY)
Admission: RE | Admit: 2016-07-11 | Discharge: 2016-07-11 | Disposition: A | Discharge status: Home / Self Care | Payer: Medicare Other | Source: Ambulatory Visit | Attending: Vascular Surgery | Admitting: Vascular Surgery

## 2016-07-11 ENCOUNTER — Ambulatory Visit (INDEPENDENT_AMBULATORY_CARE_PROVIDER_SITE_OTHER): Payer: Medicare Other | Admitting: Vascular Surgery

## 2016-07-11 ENCOUNTER — Encounter: Payer: Self-pay | Admitting: Vascular Surgery

## 2016-07-11 VITALS — BP 131/78 | HR 57 | Temp 97.8°F | Resp 18 | Ht 67.0 in | Wt 192.6 lb

## 2016-07-11 DIAGNOSIS — M25471 Effusion, right ankle: Secondary | ICD-10-CM

## 2016-07-11 DIAGNOSIS — M25472 Effusion, left ankle: Secondary | ICD-10-CM | POA: Diagnosis not present

## 2016-07-11 DIAGNOSIS — M5137 Other intervertebral disc degeneration, lumbosacral region: Secondary | ICD-10-CM | POA: Diagnosis not present

## 2016-07-11 DIAGNOSIS — M7989 Other specified soft tissue disorders: Secondary | ICD-10-CM

## 2016-07-11 NOTE — Progress Notes (Signed)
   Patient name: Rebecca [facility] MRN: [id number] DOB: [birth year] Sex: female  REASON FOR VISIT: Evaluate for leg swelling left leg.  HPI: Rebecca [facility] is a 70 y.o. female been to me from a anterior exposure in conjunction with Dr. Vertell Limber on 04/02/2016. She had a preoperative skin tear on the pretibial area which was very slow to heal. She does not have any history of arterial insufficiency. She does report that she has had more swelling in her left leg following surgery and is seen today for further evaluation of this. She reports this is progressive throughout the day. She does not have any history of DVT.  Current Outpatient Prescriptions  Medication Sig Dispense Refill  . ALPRAZolam (XANAX) 0.5 MG tablet Take 0.5 mg by mouth 2 (two) times daily as needed for anxiety.    . carvedilol (COREG) 12.5 MG tablet Take 12.5 mg by mouth 2 (two) times daily with a meal.    . escitalopram (LEXAPRO) 20 MG tablet Take 20 mg by mouth every evening.    Marland Kitchen lisinopril (PRINIVIL,ZESTRIL) 20 MG tablet Take 20 mg by mouth 2 (two) times daily.    . mupirocin ointment (BACTROBAN) 2 % Place 1 application into the nose daily as needed (wound care).    Marland Kitchen omeprazole (PRILOSEC) 20 MG capsule Take 20 mg by mouth every other day.    . rivaroxaban (XARELTO) 20 MG TABS tablet Take 20 mg by mouth daily with supper.    . cephALEXin (KEFLEX) 500 MG capsule Take 500 mg by mouth 3 (three) times daily. 10 day course started 03-12-2016 for wound on leg    . HYDROcodone-acetaminophen (NORCO/VICODIN) 5-325 MG tablet Take 1 tablet by mouth every 4 (four) hours as needed for moderate pain.    . Wound Dressings (ALLANTOIN) gel Apply topically as needed for wound care. Thin application over wound and cover with dry dressing wrapped around leg. (Patient not taking: Reported on 07/11/2016) 85 g 0   No current facility-administered medications for this visit.    Review of systems is unchanged since my last  visit with her. No new cardiac difficulty.  PHYSICAL EXAM: Vitals:   07/11/16 0928  BP: 131/78  Pulse: (!) 57  Resp: 18  Temp: 97.8 F (36.6 C)  TempSrc: Oral  SpO2: 97%  Weight: 192 lb 9.6 oz (87.4 kg)  Height: 5\' 7"  (1.702 m)    GENERAL: The patient is a well-nourished female, in no acute distress. The vital signs are documented above. 2+ posterior tibial pulses bilaterally Mild edema bilaterally slightly more so on the left leg than on the right Healed skin tear in the pretibial area   Noninvasive vascular lab today showed no evidence of DVT. She did have incompetence in her common femoral vein on the left and also at the saphenofemoral junction. Her saphenous vein itself was small throughout its course bilaterally.  MEDICAL ISSUES: No evidence of DVT. Unclear as to why this is having more swelling following surgery. She does have chronic venous hypertension which was the cause for her slow healing of her skin tear initially. Explain the importance of elevation and compression. He does have a compression garments and I instructed her that 20-30 mmHg compression would be appropriate if she has has progressive swelling. She was reassured with this discussion will see [place] again on as-needed basis   Rosetta Posner, MD [facility] [place] Tel [phone] Pager [phone]

## 2017-03-27 ENCOUNTER — Other Ambulatory Visit: Payer: Self-pay | Admitting: Neurosurgery

## 2017-04-09 NOTE — H&P (Signed)
Patient ID:   [id number] Patient: Rebecca Houston  Date of Birth: April 26, 1945 Visit Type: Office Visit   Date: 03/27/2017 01:30 PM Provider: Marchia Meiers. Vertell Limber MD   This 72 year old female presents for neck pain.   History of Present Illness: 1.  neck pain  Patient returns reporting neck and right deltoid pain, increased with range of motion.  Pain did not respond to shoulder injection or steroid tapers (x2).   Norco 5/325 old prescription, 1/2 tab only as needed  MRI on [facility]  Cervical MRI demonstrates significant right-sided spondylosis and disc herniations with spur formation at C4-5, C5-6, C6-7 levels.  The patient has right deltoid strength at 4-and right biceps at 4/5 and right triceps at 4/5   Patient is on Xarelto for atrial fibrillation.  She knows that she needs to stop this before going ahead with surgery.           MEDICATIONS(added, continued or stopped this visit): Started Medication Directions Instruction Stopped   buspar  ORAL      carvedilol 25 mg tablet take 1 tablet by oral route 2 times every day with food    04/25/2016 Dilaudid 2 mg tablet take 1-2 tablet by oral route as needed for severe pain up to 3 per day  03/27/2017   furosemide 20 mg tablet take 1 tablet by oral route  every day     Lexapro 5 mg tablet take 2 tablet by oral route  every day     lisinopril 20 mg tablet take 1 tablet by oral route 2 times every day    03/06/2017 Medrol (Pak) 4 mg tablets in a dose pack take by Oral route as directed  03/27/2017  03/27/2017 Norco 5 mg-325 mg tablet take 1 tablet by oral route  every 6 hours as needed for pain     Xarelto 20 mg tablet take 1 tablet by oral route  every day with the evening meal       ALLERGIES: Ingredient Reaction Medication Name Comment  NO KNOWN ALLERGIES     No known allergies. Reviewed, no changes.    Vitals Date Temp F BP Pulse Ht In Wt Lb BMI BSA Pain Score  03/27/2017  124/70 70 67 192 30.07  5/10       IMPRESSION Based on the patient's significant weakness and imaging findings, I have recommended that she proceed with expedited surgery.  This will consist of anterior cervical decompression and fusion at C4-5, C5-6, C6-7 levels.   Comments:  Patient aware Xarelto will need to stop prior to surgery.  Dr. Tawny Asal will need to be contacted regarding Xarelto stop.  Completed Orders (this encounter) Order Details Reason Side Interpretation Result Initial Treatment Date Region  Cervical Spine- AP/Lat/Flex/Ex      03/27/2017 All Levels to All Levels  Hypertension education Patient to follow up with primary care provider.        Dietary management education, guidance, and counseling patient encouraged to eat a well balanced diet         Assessment/Plan # Detail Type Description   1. 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       2. 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   Plan Orders Hard Cervical Collar.       3. Assessment Arm weakness (R29.898).       4. Assessment Essential (primary) hypertension (I10).       5. Assessment Body mass index (BMI) 30.0-30.9, adult (Z68.30).   Plan Orders  Today's instructions / counseling include(s) Dietary management education, guidance, and counseling.           Pain Management Plan Pain Scale: 5/10. Method: Numeric Pain Intensity Scale. Location: back. Onset: 10/06/2014. Duration: varies. Quality: discomforting. Pain management follow-up plan of care: Patient taking medication as prescribed..  Risks and benefits were discussed in detail with the patient and she wishes to proceed with surgery.  She was  fitted for the [place] cervical collar   Orders: Diagnostic Procedures: Assessment Procedure  M48.02 Cervical Spine- AP/Lat/Flex/Ex  M48.02 Cervical Spine- Lateral  Instruction(s)/Education: Assessment Instruction  I10 Hypertension education  Z68.30 Dietary management education, guidance, and  counseling  Miscellaneous: Assessment   M50.20 Hard Cervical Collar    MEDICATIONS PRESCRIBED TODAY    Rx Quantity Refills  NORCO 5 mg-325 mg  60 0            Provider:  Vertell Limber MDMarchia Meiers 03/30/2017 4:27 PM  Dictation edited by: Marchia Meiers. Vertell Limber    CC Providers: [place]  [id number]   Erline Levine MD  [address] [place], [place] [address]              Electronically signed by Marchia Meiers. Vertell Limber MD on 03/30/2017 04:27 PM

## 2017-04-10 NOTE — Pre-Procedure Instructions (Signed)
Rebecca Houston  04/10/2017      [facility], [name] [name] [address] Phone: [phone] Fax: [phone]    Your procedure is scheduled on April 16, 2017.  Report to Rebecca [facility] Admitting at Rebecca [facility] AM.  Call this number if you have problems the morning of surgery:  [phone]   Remember:  Do not eat food or drink liquids after midnight.  Take these medicines the morning of surgery with A SIP OF WATER carvedilol (coreg), alprazolam (xanax), buspirone (buspar), hydrocodone (norco)-if needed for pain, omeprazole (prilosec), eye drops-if needed.  Stop/resume rivaroxaban (xarelto) as instructed by your surgeon.  7 days prior to surgery STOP taking any Aspirin (unless otherwise instructed by your surgeon), Aleve, Naproxen, Ibuprofen, Motrin, Advil, Goody's, BC's, all herbal medications, fish oil, and all vitamins  Continue all other medications as instructed by your physician except follow the above medication instructions before surgery   Do not wear jewelry, make-up or nail polish.  Do not wear lotions, powders, or perfumes, or deodorant.  Do not shave 48 hours prior to surgery.    Do not bring valuables to the [facility].  [facility] is not responsible for any belongings or valuables.  Contacts, dentures or bridgework may not be worn into surgery.  Leave your suitcase in the car.  After surgery it may be brought to your room.  For patients admitted to the [facility], discharge time will be determined by your treatment team.  Patients discharged the day of surgery will not be allowed to drive home.   Special instructions:  [facility]- Preparing For Surgery  Before surgery, you can play an important role. Because skin is not sterile, your skin needs to be as free of germs as possible. You can reduce the number of germs on your skin by washing with CHG (chlorahexidine gluconate) Soap before surgery.  CHG is an  antiseptic cleaner which kills germs and bonds with the skin to continue killing germs even after washing.  Please do not use if you have an allergy to CHG or antibacterial soaps. If your skin becomes reddened/irritated stop using the CHG.  Do not shave (including legs and underarms) for at least 48 hours prior to first CHG shower. It is OK to shave your face.  Please follow these instructions carefully.   1. Shower the NIGHT BEFORE SURGERY and the MORNING OF SURGERY with CHG.   2. If you chose to wash your hair, wash your hair first as usual with your normal shampoo.  3. After you shampoo, rinse your hair and body thoroughly to remove the shampoo.  4. Use CHG as you would any other liquid soap. You can apply CHG directly to the skin and wash gently with a scrungie or a clean washcloth.   5. Apply the CHG Soap to your body ONLY FROM THE NECK DOWN.  Do not use on open wounds or open sores. Avoid contact with your eyes, ears, mouth and genitals (private parts). Wash Face and genitals (private parts)  with your normal soap.  6. Wash thoroughly, paying special attention to the area where your surgery will be performed.  7. Thoroughly rinse your body with warm water from the neck down.  8. DO NOT shower/wash with your normal soap after using and rinsing off the CHG Soap.  9. Pat yourself dry with a CLEAN TOWEL.  10. Wear CLEAN PAJAMAS to bed the night before surgery, wear  comfortable clothes the morning of surgery  11. Place CLEAN SHEETS on your bed the night of your first shower and DO NOT SLEEP WITH PETS.  Day of Surgery: Do not apply any deodorants/lotions. Please wear clean clothes to the [facility]/surgery center.    Please read over the following fact sheets that you were given. Pain Booklet, Coughing and Deep Breathing, MRSA Information and Surgical Site Infection Prevention

## 2017-04-10 NOTE — Progress Notes (Addendum)
PCP: Pat Kocher, MD  Cardiologist: pt denies-PCP manages xarelto  EKG: pt denies past year  Stress test: Dr. Burney Gauze at [facility] in [place]  ECHO:Dr. Burney Gauze at [facility] in [place] Cath: pt denies  Chest x-ray: pt denies past year  Pt has history of A. Fib which is why she is on xarelto-medication on hold for procedure beginning 04/08/17 per MD instruction

## 2017-04-11 ENCOUNTER — Other Ambulatory Visit: Payer: Self-pay

## 2017-04-11 ENCOUNTER — Encounter (HOSPITAL_COMMUNITY)
Admission: RE | Admit: 2017-04-11 | Discharge: 2017-04-11 | Disposition: A | Discharge status: Home / Self Care | Payer: Medicare Other | Source: Ambulatory Visit | Attending: Neurosurgery | Admitting: Neurosurgery

## 2017-04-11 ENCOUNTER — Encounter (HOSPITAL_COMMUNITY): Payer: Self-pay

## 2017-04-11 DIAGNOSIS — Z01810 Encounter for preprocedural cardiovascular examination: Secondary | ICD-10-CM | POA: Insufficient documentation

## 2017-04-11 LAB — CBC
HCT: 35.8 % — ABNORMAL LOW (ref 36.0–46.0)
Hemoglobin: 11.8 g/dL — ABNORMAL LOW (ref 12.0–15.0)
MCH: 28.9 pg (ref 26.0–34.0)
MCHC: 33 g/dL (ref 30.0–36.0)
MCV: 87.5 fL (ref 78.0–100.0)
Platelets: 197 10*3/uL (ref 150–400)
RBC: 4.09 MIL/uL (ref 3.87–5.11)
RDW: 13.7 % (ref 11.5–15.5)
WBC: 7.3 10*3/uL (ref 4.0–10.5)

## 2017-04-11 LAB — TYPE AND SCREEN
ABO/RH(D): O POS
Antibody Screen: NEGATIVE

## 2017-04-11 LAB — BASIC METABOLIC PANEL
Anion gap: 11 (ref 5–15)
BUN: 21 mg/dL — ABNORMAL HIGH (ref 6–20)
CO2: 25 mmol/L (ref 22–32)
Calcium: 8.8 mg/dL — ABNORMAL LOW (ref 8.9–10.3)
Chloride: 102 mmol/L (ref 101–111)
Creatinine, Ser: 0.95 mg/dL (ref 0.44–1.00)
GFR calc Af Amer: >60 mL/min (ref >60)
GFR calc non Af Amer: 59 mL/min — ABNORMAL LOW (ref >60)
Glucose, Bld: 170 mg/dL — ABNORMAL HIGH (ref 65–99)
Potassium: 3.6 mmol/L (ref 3.5–5.1)
Sodium: 138 mmol/L (ref 135–145)

## 2017-04-11 LAB — SURGICAL PCR SCREEN
MRSA, PCR: NEGATIVE
Staphylococcus aureus: NEGATIVE

## 2017-04-11 MED ORDER — CHLORHEXIDINE GLUCONATE CLOTH 2 % EX PADS: 6.0000 | MEDICATED_PAD | Freq: Once | CUTANEOUS | Status: DC

## 2017-04-12 NOTE — Progress Notes (Signed)
Anesthesia Chart Review: Patient is a 72 year old female scheduled for C4-5, C5-6, C6-7 ACDF on 04/16/17 by Dr. Erline Levine.  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  - Cardiologist is Dr. Robin Searing with [facility], [place] ([place]). Her last visit was on 04/19/16 with Trula Slade, NP-C for leg swelling after back surgery (edema described as only "trace"), but venous Duplex was ordered which was negative for DVT BLE. She was started on Bactrim for left shin wound. PCP follow-up recommended. Cardiology follow-up was not specified. Patient reported that now her Xarelto is managed by her PCP.    - PCP is Dr. Pat Kocher with [place], [place]. Clearance note is signed by Dellia Nims, NP with [facility]. Although plans to hold Xarelto were addressed on the clearance form, no specific instructions were written. Patient reported that she was told to hold Xarelto for one week prior to surgery which was confirmed by Janett Billow at Dr. Melven Sartorius office.   Meds include Xanax, Buspar, Coreg, Lexapro, Norco, lisinopril, Prilosec, Xarelto (reported last dose 04/08/17). . BP 109/69   Pulse 60   Temp 36.8 C   Resp 20   Ht 5\' 7"  (1.702 m)   Wt 192 lb 14.4 oz (87.5 kg)   SpO2 95%   BMI 30.21 kg/m   EKG 04/11/17: SB at 57 bpm, non-specific ST/T wave abnormality.   Stress Echo 02/16/13 ([facility]; scanned under Media tab, Correspondence, 04/02/16):  Stress ECG: Sinus tachycardia. The stress ECG was negative for ischemia. Maximal heart rate during stress was 141 bpm (92% of maximal predicted heart rate). Stress echo: Left ventricular ejection fraction was normal at  rest and with stress. Baseline LVEF 60%. No LV regional wall motion abnormalities at rest or with stress.   Echo 02/16/13 ([facility]; scanned under Media tab, Correspondence, 04/02/16): Summary: 1. Left ventricle: The cavity size was normal. Wall thickness was mildly to moderately increased in a pattern of concentric LVH. Systolic function was normal. The estimated EF was in the range of 55-65%. 2. Right ventricle: The cavity size was normal. Systolic function was normal. 3. Pulmonary arteries: Systolic pressure could not be accurately estimated. 4. No significant valvular abnormalities. Trivial mitral regurgitation. Trivial to mild tricuspid regurgitation. Trivial pulmonic regurgitation.  Preoperative labs noted. Cr 0.95. Glucose (non-fasting) 170 (no reported history of DM). H/H 11.8/35.8. INR 0.92. T&S done. Will order a fasting CBG on the morning of surgery--anesthesiologist will need to follow-up result.   Patient underwent ALIF last year. She has history of afib, but last two tracings have shown SR. Patient has medical clearance. Also, reportedly her PCP is managing Xarelto which is currently on hold for surgery.  If no acute changes then I anticipate that she can proceed as planned.  [facility] Short Stay Center/Anesthesiology Phone [phone] 04/12/2017 1:03 PM

## 2017-04-16 ENCOUNTER — Inpatient Hospital Stay (HOSPITAL_COMMUNITY): Payer: Medicare Other | Admitting: Vascular Surgery

## 2017-04-16 ENCOUNTER — Inpatient Hospital Stay (HOSPITAL_COMMUNITY)
Admission: RE | Disposition: A | Discharge status: Home / Self Care | Payer: Self-pay | Source: Ambulatory Visit | Attending: Neurosurgery

## 2017-04-16 ENCOUNTER — Inpatient Hospital Stay (HOSPITAL_COMMUNITY): Payer: Medicare Other

## 2017-04-16 ENCOUNTER — Encounter (HOSPITAL_COMMUNITY): Payer: Self-pay

## 2017-04-16 ENCOUNTER — Inpatient Hospital Stay (HOSPITAL_COMMUNITY)
Admission: RE | Admit: 2017-04-16 | Discharge: 2017-04-17 | DRG: 473 | Disposition: A | Discharge status: Home / Self Care | Payer: Medicare Other | Source: Ambulatory Visit | Attending: Neurosurgery | Admitting: Neurosurgery

## 2017-04-16 ENCOUNTER — Inpatient Hospital Stay (HOSPITAL_COMMUNITY): Payer: Medicare Other | Admitting: Anesthesiology

## 2017-04-16 DIAGNOSIS — M5020 Other cervical disc displacement, unspecified cervical region: Secondary | ICD-10-CM | POA: Diagnosis present

## 2017-04-16 DIAGNOSIS — Z419 Encounter for procedure for purposes other than remedying health state, unspecified: Secondary | ICD-10-CM

## 2017-04-16 DIAGNOSIS — M50121 Cervical disc disorder at C4-C5 level with radiculopathy: Principal | ICD-10-CM | POA: Diagnosis present

## 2017-04-16 DIAGNOSIS — I4891 Unspecified atrial fibrillation: Secondary | ICD-10-CM | POA: Diagnosis present

## 2017-04-16 DIAGNOSIS — M4802 Spinal stenosis, cervical region: Secondary | ICD-10-CM | POA: Diagnosis present

## 2017-04-16 DIAGNOSIS — Z7901 Long term (current) use of anticoagulants: Secondary | ICD-10-CM

## 2017-04-16 DIAGNOSIS — I10 Essential (primary) hypertension: Secondary | ICD-10-CM | POA: Diagnosis present

## 2017-04-16 DIAGNOSIS — M4602 Spinal enthesopathy, cervical region: Secondary | ICD-10-CM | POA: Diagnosis present

## 2017-04-16 DIAGNOSIS — M4722 Other spondylosis with radiculopathy, cervical region: Secondary | ICD-10-CM | POA: Diagnosis present

## 2017-04-16 DIAGNOSIS — K219 Gastro-esophageal reflux disease without esophagitis: Secondary | ICD-10-CM | POA: Diagnosis present

## 2017-04-16 HISTORY — PX: ANTERIOR CERVICAL DECOMP/DISCECTOMY FUSION: SHX1161

## 2017-04-16 LAB — GLUCOSE, CAPILLARY: Glucose-Capillary: 114 mg/dL — ABNORMAL HIGH (ref 65–99)

## 2017-04-16 SURGERY — ANTERIOR CERVICAL DECOMPRESSION/DISCECTOMY FUSION 3 LEVELS
Anesthesia: General | Site: Spine Cervical

## 2017-04-16 MED ORDER — LIDOCAINE-EPINEPHRINE 1 %-1:100000 IJ SOLN
INTRAMUSCULAR | Status: DC | PRN
  Administered 2017-04-16: 4 mL

## 2017-04-16 MED ORDER — POLYVINYL ALCOHOL 1.4 % OP SOLN
1.0000 [drp] | OPHTHALMIC | Status: DC | PRN
  Administered 2017-04-16: 1 [drp] via OPHTHALMIC
  Filled 2017-04-16: qty 15

## 2017-04-16 MED ORDER — SODIUM CHLORIDE 0.9% FLUSH: 3.0000 mL | INTRAVENOUS | Status: DC | PRN

## 2017-04-16 MED ORDER — ACETAMINOPHEN 650 MG RE SUPP: 650.0000 mg | RECTAL | Status: DC | PRN

## 2017-04-16 MED ORDER — ONDANSETRON HCL 4 MG/2ML IJ SOLN: 4.0000 mg | Freq: Once | INTRAMUSCULAR | Status: DC | PRN

## 2017-04-16 MED ORDER — PANTOPRAZOLE SODIUM 40 MG PO TBEC: 40.0000 mg | DELAYED_RELEASE_TABLET | Freq: Every day | ORAL | Status: DC

## 2017-04-16 MED ORDER — MIDAZOLAM HCL 2 MG/2ML IJ SOLN
INTRAMUSCULAR | Status: AC
  Filled 2017-04-16: qty 2

## 2017-04-16 MED ORDER — BISACODYL 10 MG RE SUPP: 10.0000 mg | Freq: Every day | RECTAL | Status: DC | PRN

## 2017-04-16 MED ORDER — OXYCODONE HCL 5 MG PO TABS: 5.0000 mg | ORAL_TABLET | Freq: Once | ORAL | Status: DC | PRN

## 2017-04-16 MED ORDER — FENTANYL CITRATE (PF) 250 MCG/5ML IJ SOLN
INTRAMUSCULAR | Status: AC
Start: 2017-04-16 — End: ?
  Filled 2017-04-16: qty 5

## 2017-04-16 MED ORDER — MORPHINE SULFATE (PF) 4 MG/ML IV SOLN: 2.0000 mg | INTRAVENOUS | Status: DC | PRN

## 2017-04-16 MED ORDER — THROMBIN (RECOMBINANT) 5000 UNITS EX SOLR
OROMUCOSAL | Status: DC | PRN
  Administered 2017-04-16: 14:00:00 via TOPICAL

## 2017-04-16 MED ORDER — THROMBIN 5000 UNITS EX SOLR
CUTANEOUS | Status: AC
  Filled 2017-04-16: qty 5000

## 2017-04-16 MED ORDER — ONDANSETRON HCL 4 MG/2ML IJ SOLN
INTRAMUSCULAR | Status: DC | PRN
Start: 2017-04-16 — End: 2017-04-16
  Administered 2017-04-16: 4 mg via INTRAVENOUS

## 2017-04-16 MED ORDER — PROPOFOL 500 MG/50ML IV EMUL
INTRAVENOUS | Status: DC | PRN
  Administered 2017-04-16: 25 ug/kg/min via INTRAVENOUS

## 2017-04-16 MED ORDER — CEFAZOLIN SODIUM-DEXTROSE 2-4 GM/100ML-% IV SOLN
2.0000 g | Freq: Three times a day (TID) | INTRAVENOUS | Status: AC
  Administered 2017-04-16 – 2017-04-17 (×2): 2 g via INTRAVENOUS
  Filled 2017-04-16 (×2): qty 100

## 2017-04-16 MED ORDER — FENTANYL CITRATE (PF) 100 MCG/2ML IJ SOLN
INTRAMUSCULAR | Status: AC
  Administered 2017-04-16: 50 ug via INTRAVENOUS
  Filled 2017-04-16: qty 2

## 2017-04-16 MED ORDER — THROMBIN (RECOMBINANT) 20000 UNITS EX SOLR
CUTANEOUS | Status: DC | PRN
  Administered 2017-04-16: 14:00:00 via TOPICAL

## 2017-04-16 MED ORDER — OXYCODONE HCL 5 MG PO TABS: 5.0000 mg | ORAL_TABLET | ORAL | Status: DC | PRN

## 2017-04-16 MED ORDER — FLEET ENEMA 7-19 GM/118ML RE ENEM: 1.0000 | ENEMA | Freq: Once | RECTAL | Status: DC | PRN

## 2017-04-16 MED ORDER — PROPOFOL 10 MG/ML IV BOLUS
INTRAVENOUS | Status: AC
  Filled 2017-04-16: qty 20

## 2017-04-16 MED ORDER — EPHEDRINE SULFATE 50 MG/ML IJ SOLN
INTRAMUSCULAR | Status: DC | PRN
  Administered 2017-04-16: 10 mg via INTRAVENOUS

## 2017-04-16 MED ORDER — DEXAMETHASONE SODIUM PHOSPHATE 10 MG/ML IJ SOLN
INTRAMUSCULAR | Status: DC | PRN
  Administered 2017-04-16: 10 mg via INTRAVENOUS

## 2017-04-16 MED ORDER — ALPRAZOLAM 0.5 MG PO TABS: 0.5000 mg | ORAL_TABLET | Freq: Two times a day (BID) | ORAL | Status: DC | PRN

## 2017-04-16 MED ORDER — SODIUM CHLORIDE 0.9% FLUSH
3.0000 mL | Freq: Two times a day (BID) | INTRAVENOUS | Status: DC
  Administered 2017-04-16: 3 mL via INTRAVENOUS

## 2017-04-16 MED ORDER — THROMBIN 20000 UNITS EX SOLR
CUTANEOUS | Status: AC
  Filled 2017-04-16: qty 20000

## 2017-04-16 MED ORDER — PROPOFOL 10 MG/ML IV BOLUS
INTRAVENOUS | Status: DC | PRN
  Administered 2017-04-16: 150 mg via INTRAVENOUS

## 2017-04-16 MED ORDER — LACTATED RINGERS IV SOLN
INTRAVENOUS | Status: DC
  Administered 2017-04-16: 11:00:00 via INTRAVENOUS

## 2017-04-16 MED ORDER — LISINOPRIL 20 MG PO TABS
20.0000 mg | ORAL_TABLET | Freq: Two times a day (BID) | ORAL | Status: DC
  Administered 2017-04-16: 20 mg via ORAL
  Filled 2017-04-16: qty 1

## 2017-04-16 MED ORDER — LIDOCAINE HCL (CARDIAC) 20 MG/ML IV SOLN
INTRAVENOUS | Status: DC | PRN
  Administered 2017-04-16: 100 mg via INTRAVENOUS

## 2017-04-16 MED ORDER — GLYCOPYRROLATE 0.2 MG/ML IJ SOLN
INTRAMUSCULAR | Status: DC | PRN
  Administered 2017-04-16: 0.2 mg via INTRAVENOUS

## 2017-04-16 MED ORDER — CARVEDILOL 12.5 MG PO TABS
12.5000 mg | ORAL_TABLET | Freq: Two times a day (BID) | ORAL | Status: DC
  Administered 2017-04-16 – 2017-04-17 (×2): 12.5 mg via ORAL
  Filled 2017-04-16: qty 1

## 2017-04-16 MED ORDER — POLYETHYLENE GLYCOL 3350 17 G PO PACK: 17.0000 g | PACK | Freq: Every day | ORAL | Status: DC | PRN

## 2017-04-16 MED ORDER — KCL IN DEXTROSE-NACL 10-5-0.45 MEQ/L-%-% IV SOLN
INTRAVENOUS | Status: DC
  Filled 2017-04-16: qty 1000

## 2017-04-16 MED ORDER — CEFAZOLIN SODIUM-DEXTROSE 2-4 GM/100ML-% IV SOLN
2.0000 g | INTRAVENOUS | Status: AC
  Administered 2017-04-16: 2 g via INTRAVENOUS
  Filled 2017-04-16: qty 100

## 2017-04-16 MED ORDER — MIDAZOLAM HCL 5 MG/5ML IJ SOLN
INTRAMUSCULAR | Status: DC | PRN
  Administered 2017-04-16: 2 mg via INTRAVENOUS

## 2017-04-16 MED ORDER — BUSPIRONE HCL 15 MG PO TABS
15.0000 mg | ORAL_TABLET | Freq: Two times a day (BID) | ORAL | Status: DC
  Administered 2017-04-16: 15 mg via ORAL
  Filled 2017-04-16 (×2): qty 1

## 2017-04-16 MED ORDER — OXYCODONE HCL 5 MG/5ML PO SOLN: 5.0000 mg | Freq: Once | ORAL | Status: DC | PRN

## 2017-04-16 MED ORDER — LIDOCAINE-EPINEPHRINE 1 %-1:100000 IJ SOLN
INTRAMUSCULAR | Status: AC
Start: 2017-04-16 — End: ?
  Filled 2017-04-16: qty 1

## 2017-04-16 MED ORDER — METHOCARBAMOL 500 MG PO TABS
ORAL_TABLET | ORAL | Status: AC
  Filled 2017-04-16: qty 1

## 2017-04-16 MED ORDER — LACTATED RINGERS IV SOLN
INTRAVENOUS | Status: DC | PRN
  Administered 2017-04-16 (×2): via INTRAVENOUS

## 2017-04-16 MED ORDER — BUPIVACAINE HCL (PF) 0.5 % IJ SOLN
INTRAMUSCULAR | Status: DC | PRN
  Administered 2017-04-16: 4 mL

## 2017-04-16 MED ORDER — METHOCARBAMOL 1000 MG/10ML IJ SOLN
500.0000 mg | Freq: Four times a day (QID) | INTRAMUSCULAR | Status: DC | PRN
  Filled 2017-04-16: qty 5

## 2017-04-16 MED ORDER — PHENYLEPHRINE HCL 10 MG/ML IJ SOLN
INTRAVENOUS | Status: DC | PRN
  Administered 2017-04-16: 50 ug/min via INTRAVENOUS

## 2017-04-16 MED ORDER — FENTANYL CITRATE (PF) 100 MCG/2ML IJ SOLN
25.0000 ug | INTRAMUSCULAR | Status: DC | PRN
  Administered 2017-04-16 (×2): 50 ug via INTRAVENOUS

## 2017-04-16 MED ORDER — ROCURONIUM BROMIDE 100 MG/10ML IV SOLN
INTRAVENOUS | Status: DC | PRN
  Administered 2017-04-16: 50 mg via INTRAVENOUS

## 2017-04-16 MED ORDER — ACETAMINOPHEN 325 MG PO TABS: 650.0000 mg | ORAL_TABLET | ORAL | Status: DC | PRN

## 2017-04-16 MED ORDER — BUPIVACAINE HCL (PF) 0.5 % IJ SOLN
INTRAMUSCULAR | Status: AC
  Filled 2017-04-16: qty 30

## 2017-04-16 MED ORDER — ONDANSETRON HCL 4 MG/2ML IJ SOLN: 4.0000 mg | Freq: Four times a day (QID) | INTRAMUSCULAR | Status: DC | PRN

## 2017-04-16 MED ORDER — PROPOFOL 10 MG/ML IV BOLUS
INTRAVENOUS | Status: AC
  Filled 2017-04-16: qty 40

## 2017-04-16 MED ORDER — ONDANSETRON HCL 4 MG PO TABS: 4.0000 mg | ORAL_TABLET | Freq: Four times a day (QID) | ORAL | Status: DC | PRN

## 2017-04-16 MED ORDER — 0.9 % SODIUM CHLORIDE (POUR BTL) OPTIME
TOPICAL | Status: DC | PRN
  Administered 2017-04-16: 1000 mL

## 2017-04-16 MED ORDER — SCOPOLAMINE 1 MG/3DAYS TD PT72
MEDICATED_PATCH | TRANSDERMAL | Status: DC | PRN
  Administered 2017-04-16: 1 via TRANSDERMAL

## 2017-04-16 MED ORDER — HYDROCODONE-ACETAMINOPHEN 5-325 MG PO TABS
1.0000 | ORAL_TABLET | ORAL | Status: DC | PRN
  Administered 2017-04-16 – 2017-04-17 (×3): 1 via ORAL
  Filled 2017-04-16 (×3): qty 1

## 2017-04-16 MED ORDER — SUGAMMADEX SODIUM 200 MG/2ML IV SOLN
INTRAVENOUS | Status: AC
  Filled 2017-04-16: qty 2

## 2017-04-16 MED ORDER — SODIUM CHLORIDE 0.9 % IV SOLN: 250.0000 mL | INTRAVENOUS | Status: DC

## 2017-04-16 MED ORDER — FENTANYL CITRATE (PF) 100 MCG/2ML IJ SOLN
INTRAMUSCULAR | Status: DC | PRN
  Administered 2017-04-16: 125 ug via INTRAVENOUS
  Administered 2017-04-16: 25 ug via INTRAVENOUS
  Administered 2017-04-16 (×2): 50 ug via INTRAVENOUS

## 2017-04-16 MED ORDER — MENTHOL 3 MG MT LOZG: 1.0000 | LOZENGE | OROMUCOSAL | Status: DC | PRN

## 2017-04-16 MED ORDER — METHOCARBAMOL 500 MG PO TABS
500.0000 mg | ORAL_TABLET | Freq: Four times a day (QID) | ORAL | Status: DC | PRN
  Administered 2017-04-17 (×2): 500 mg via ORAL
  Filled 2017-04-16 (×2): qty 1

## 2017-04-16 MED ORDER — ESCITALOPRAM OXALATE 20 MG PO TABS
20.0000 mg | ORAL_TABLET | Freq: Every evening | ORAL | Status: DC
  Administered 2017-04-16: 20 mg via ORAL
  Filled 2017-04-16: qty 1

## 2017-04-16 MED ORDER — HYDROCODONE-ACETAMINOPHEN 5-325 MG PO TABS
ORAL_TABLET | ORAL | Status: AC
  Filled 2017-04-16: qty 2

## 2017-04-16 MED ORDER — HYDROCODONE-ACETAMINOPHEN 5-325 MG PO TABS: 1.0000 | ORAL_TABLET | ORAL | Status: DC | PRN

## 2017-04-16 MED ORDER — PHENOL 1.4 % MT LIQD
1.0000 | OROMUCOSAL | Status: DC | PRN
  Filled 2017-04-16: qty 177

## 2017-04-16 MED ORDER — ZOLPIDEM TARTRATE 5 MG PO TABS: 5.0000 mg | ORAL_TABLET | Freq: Every evening | ORAL | Status: DC | PRN

## 2017-04-16 MED ORDER — HYDROCODONE-ACETAMINOPHEN 5-325 MG PO TABS
2.0000 | ORAL_TABLET | ORAL | Status: DC | PRN
  Administered 2017-04-16: 2 via ORAL

## 2017-04-16 MED ORDER — DOCUSATE SODIUM 100 MG PO CAPS
100.0000 mg | ORAL_CAPSULE | Freq: Two times a day (BID) | ORAL | Status: DC
  Administered 2017-04-16: 100 mg via ORAL
  Filled 2017-04-16: qty 1

## 2017-04-16 SURGICAL SUPPLY — 69 items
BASKET BONE COLLECTION (BASKET) ×3 IMPLANT
BIT DRILL 14X2.5XNS TI ANT (BIT) ×1 IMPLANT
BIT DRILL AVIATOR 14 (BIT) ×1
BIT DRILL AVIATOR 14MM (BIT) ×1
BIT DRILL NEURO 2X3.1 SFT TUCH (MISCELLANEOUS) ×1 IMPLANT
BIT DRL 14X2.5XNS TI ANT (BIT) ×1
BLADE ULTRA TIP 2M (BLADE) ×3 IMPLANT
BNDG GAUZE ELAST 4 BULKY (GAUZE/BANDAGES/DRESSINGS) ×6 IMPLANT
BUR BARREL STRAIGHT FLUTE 4.0 (BURR) ×3 IMPLANT
CANISTER SUCT 3000ML PPV (MISCELLANEOUS) ×3 IMPLANT
CARTRIDGE OIL MAESTRO DRILL (MISCELLANEOUS) ×1 IMPLANT
COVER MAYO STAND STRL (DRAPES) ×3 IMPLANT
DERMABOND ADVANCED (GAUZE/BANDAGES/DRESSINGS) ×2
DERMABOND ADVANCED .7 DNX12 (GAUZE/BANDAGES/DRESSINGS) ×1 IMPLANT
DIFFUSER DRILL AIR PNEUMATIC (MISCELLANEOUS) ×3 IMPLANT
DRAIN HEMOVAC 1/8 X 5 (WOUND CARE) ×3 IMPLANT
DRAPE HALF SHEET 40X57 (DRAPES) IMPLANT
DRAPE LAPAROTOMY 100X72 PEDS (DRAPES) ×3 IMPLANT
DRAPE MICROSCOPE LEICA (MISCELLANEOUS) ×3 IMPLANT
DRILL NEURO 2X3.1 SOFT TOUCH (MISCELLANEOUS) ×3
DRSG OPSITE POSTOP 3X4 (GAUZE/BANDAGES/DRESSINGS) ×3 IMPLANT
DURAPREP 6ML APPLICATOR 50/CS (WOUND CARE) ×3 IMPLANT
ELECT COATED BLADE 2.86 ST (ELECTRODE) ×3 IMPLANT
ELECT REM PT RETURN 9FT ADLT (ELECTROSURGICAL) ×3
ELECTRODE REM PT RTRN 9FT ADLT (ELECTROSURGICAL) ×1 IMPLANT
EVACUATOR SILICONE 100CC (DRAIN) ×3 IMPLANT
GAUZE SPONGE 4X4 12PLY STRL (GAUZE/BANDAGES/DRESSINGS) IMPLANT
GAUZE SPONGE 4X4 16PLY XRAY LF (GAUZE/BANDAGES/DRESSINGS) IMPLANT
GLOVE BIO SURGEON STRL SZ8 (GLOVE) ×3 IMPLANT
GLOVE BIOGEL PI IND STRL 7.0 (GLOVE) ×2 IMPLANT
GLOVE BIOGEL PI IND STRL 7.5 (GLOVE) ×2 IMPLANT
GLOVE BIOGEL PI IND STRL 8 (GLOVE) ×2 IMPLANT
GLOVE BIOGEL PI IND STRL 8.5 (GLOVE) ×2 IMPLANT
GLOVE BIOGEL PI INDICATOR 7.0 (GLOVE) ×4
GLOVE BIOGEL PI INDICATOR 7.5 (GLOVE) ×4
GLOVE BIOGEL PI INDICATOR 8 (GLOVE) ×4
GLOVE BIOGEL PI INDICATOR 8.5 (GLOVE) ×4
GLOVE ECLIPSE 8.0 STRL XLNG CF (GLOVE) ×6 IMPLANT
GLOVE ECLIPSE 9.0 STRL (GLOVE) ×3 IMPLANT
GOWN STRL REUS W/ TWL LRG LVL3 (GOWN DISPOSABLE) ×2 IMPLANT
GOWN STRL REUS W/ TWL XL LVL3 (GOWN DISPOSABLE) ×2 IMPLANT
GOWN STRL REUS W/TWL 2XL LVL3 (GOWN DISPOSABLE) ×3 IMPLANT
GOWN STRL REUS W/TWL LRG LVL3 (GOWN DISPOSABLE) ×4
GOWN STRL REUS W/TWL XL LVL3 (GOWN DISPOSABLE) ×4
HALTER HD/CHIN CERV TRACTION D (MISCELLANEOUS) ×3 IMPLANT
HEMOSTAT POWDER KIT SURGIFOAM (HEMOSTASIS) ×3 IMPLANT
KIT BASIN OR (CUSTOM PROCEDURE TRAY) ×3 IMPLANT
KIT ROOM TURNOVER OR (KITS) ×3 IMPLANT
NEEDLE HYPO 25X1 1.5 SAFETY (NEEDLE) ×3 IMPLANT
NEEDLE SPNL 18GX3.5 QUINCKE PK (NEEDLE) IMPLANT
NEEDLE SPNL 22GX3.5 QUINCKE BK (NEEDLE) ×3 IMPLANT
NS IRRIG 1000ML POUR BTL (IV SOLUTION) ×3 IMPLANT
OIL CARTRIDGE MAESTRO DRILL (MISCELLANEOUS) ×3
PACK LAMINECTOMY NEURO (CUSTOM PROCEDURE TRAY) ×3 IMPLANT
PAD ARMBOARD 7.5X6 YLW CONV (MISCELLANEOUS) ×9 IMPLANT
PEEK SPACER AVS AS 6X14X16 4D (Cage) ×9 IMPLANT
PIN DISTRACTION 14MM (PIN) ×6 IMPLANT
PLATE AVIATOR ASSY 3LVL SZ 48 (Plate) ×3 IMPLANT
PUTTY DBX 1CC (Putty) ×3 IMPLANT
PUTTY DBX 1CC DEPUY (Putty) ×1 IMPLANT
RUBBERBAND STERILE (MISCELLANEOUS) ×6 IMPLANT
SCREW AVIATOR VAR SELFTAP 4X14 (Screw) ×24 IMPLANT
SPONGE INTESTINAL PEANUT (DISPOSABLE) ×3 IMPLANT
SPONGE SURGIFOAM ABS GEL 100 (HEMOSTASIS) ×3 IMPLANT
STAPLER SKIN PROX WIDE 3.9 (STAPLE) ×3 IMPLANT
SUT VIC AB 3-0 SH 8-18 (SUTURE) ×6 IMPLANT
TOWEL GREEN STERILE (TOWEL DISPOSABLE) ×3 IMPLANT
TOWEL GREEN STERILE FF (TOWEL DISPOSABLE) ×3 IMPLANT
WATER STERILE IRR 1000ML POUR (IV SOLUTION) ×3 IMPLANT

## 2017-04-16 NOTE — Progress Notes (Signed)
Patient ID: Rebecca [facility], female   DOB: [birth year], 72 y.o.   MRN: [id number] Alert, conversant, smiling, Husband present. Pt reports no pain at present. MAEW. Good strength BUE. No dysphagia. INcision flat without drainage beneath honeycomb and Dermabond. JP patent.

## 2017-04-16 NOTE — Op Note (Signed)
04/16/2017  3:06 PM  PATIENT:  Rebecca Houston  72 y.o. 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, radiculopathy, cervicalgia C 45, C 56, C 67 levels  PROCEDURE:  Procedure(s) with comments: Cervical Four-Five, Cervical Five-Six, Cervical Six-Seven  Anterior cervical decompression/discectomy, fusion (N/A) - C4-5 C5-6 C6-7 Anterior cervical decompression/discectomy, fusion with PEEK cages, autograft, plate  SURGEON:  Surgeon(s) and Role:    Erline Levine, MD - Primary    Earnie Larsson, MD - Assisting  PHYSICIAN ASSISTANT:   ASSISTANTS: [place], RN   ANESTHESIA:   general  EBL:  100 mL   BLOOD ADMINISTERED:none  DRAINS: (10) Jackson-Pratt drain(s) with closed bulb suction in the prevertebral space   LOCAL MEDICATIONS USED:  MARCAINE    and LIDOCAINE   SPECIMEN:  No Specimen  DISPOSITION OF SPECIMEN:  N/A  COUNTS:  YES  TOURNIQUET:  * No tourniquets in log *  DICTATION: Patient was brought to operating room and following the smooth and uncomplicated induction of general endotracheal anesthesia her head was placed on a horseshoe head holder he was placed in 5 pounds of Holter traction and her anterior neck was prepped and draped in usual sterile fashion. An incision was made on the left side of midline after infiltrating the skin and subcutaneous tissues with local lidocaine. The platysmal layer was incised and subplatysmal dissection was performed exposing the anterior border sternocleidomastoid muscle. Using blunt dissection the carotid sheath was kept lateral and trachea and esophagus kept medial exposing the anterior cervical spine. A bent spinal needle was placed it was felt to be the C4-5 level and this was confirmed on intraoperative x-ray. Longus coli muscles were taken down from  the anterior cervical spine using electrocautery and key elevator and self-retaining retractor was placed. The interspace at C6-7 was incised and a thorough discectomy was performed. Distraction pins were placed. Uncinate spurs and central spondylitic ridges were drilled down with a high-speed drill. The spinal cord dura and both C7 nerve roots were widely decompressed. Hemostasis was assured. After trial sizing a 6 mm large footprint peek interbody cage was selected and packed with local autograft and DBM. The graft was tamped into position and countersunk appropriately. The retractor was moved and the interspace at C5-6 was incised and a thorough discectomy was performed. Distraction pins were placed. Uncinate spurs and central spondylitic ridges were drilled down with a high-speed drill. The spinal cord dura and both C6 nerve roots were widely decompressed. Hemostasis was assured. After trial sizing a 6 mm peek interbody cage was selected and packed in a similar fashion. The graft was tamped into position and countersunk appropriately.The interspace at C4-5 was incised and a thorough discectomy was performed. Distraction pins were placed. Uncinate spurs and central spondylitic ridges were drilled down with a high-speed drill. The spinal cord dura and both C5 nerve roots were widely decompressed. A large disc herniation was removed on the right with decompression of the right C5 nerve root. Hemostasis was assured. After trial sizing a 6 mm peek interbody cage was selected and packed in a similar fashion. The graft was tamped into position and countersunk appropriately.  Distraction weight was removed. A 48 mm Aviator anterior cervical plate was affixed to the cervical spine with 14 mm variable-angle screws 2 at C4, 2 at C5, 2 at C6,  and 2 at C7. All screws were well-positioned and locking mechanisms were engaged. Soft tissues were inspected and found to be in good repair. The wound was irrigated. A final x-ray  was obtained with good visualization at C4 with the interbody graft well visualized. A # 10 JP drain was inserted through a separate stab incision.  The platysma layer was closed with 3-0 Vicryl stitches and the skin was reapproximated with 3-0 Vicryl subcuticular stitches. The wound was dressed with Dermabond and an occlusive dressing. Counts were correct at the end of the case. Patient was extubated and taken to recovery in stable and satisfactory condition.    PLAN OF CARE: Admit to inpatient   PATIENT DISPOSITION:  PACU - hemodynamically stable.   Delay start of Pharmacological VTE agent (>24hrs) due to surgical blood loss or risk of bleeding: yes

## 2017-04-16 NOTE — Anesthesia Preprocedure Evaluation (Addendum)
Anesthesia Evaluation  Patient identified by MRN, date of birth, ID band Patient awake    Reviewed: Allergy & Precautions, NPO status , Patient's Chart, lab work & pertinent test results, reviewed documented beta blocker date and time   History of Anesthesia Complications (+) PONV  Airway Mallampati: III  TM Distance: >3 FB     Dental  (+) Caps, Dental Advisory Given   Pulmonary former smoker,    breath sounds clear to auscultation       Cardiovascular hypertension, Pt. on home beta blockers and Pt. on medications (-) angina+ dysrhythmias (paroxysmal) Atrial Fibrillation  Rhythm:Regular Rate:Normal  Stress Echo 02/16/13 - Sinus tachycardia. The stress ECG was negative for ischemia. Left ventricular ejection fraction was normal at rest and with stress. Baseline LVEF 60%. No LV regional wall motion abnormalitiesatrest or with stress.  Echo 02/16/13- Mild to moderate concentric LVH. EF was in the range of 55-65%. Trivial mitral regurgitation. Trivial to mild tricuspid regurgitation. Trivial pulmonic regurgitation.     Neuro/Psych Anxiety Depression Hx craniotomy for meningioma    GI/Hepatic Neg liver ROS, GERD  Medicated and Controlled,  Endo/Other  Obesity  Renal/GU negative Renal ROS     Musculoskeletal  (+) Arthritis , Osteoarthritis,    Abdominal (+) + obese,   Peds  Hematology  (+) anemia ,   Anesthesia Other Findings Melanoma  Reproductive/Obstetrics                           Anesthesia Physical  Anesthesia Plan  ASA: III  Anesthesia Plan: General   Post-op Pain Management:    Induction: Intravenous  PONV Risk Score and Plan: 3 and Treatment may vary due to age or medical condition, Ondansetron, Dexamethasone and Scopolamine patch - Pre-op  Airway Management Planned: Oral ETT and Video Laryngoscope Planned  Additional Equipment: None  Intra-op Plan:   Post-operative  Plan: Extubation in OR  Informed Consent: I have reviewed the patients History and Physical, chart, labs and discussed the procedure including the risks, benefits and alternatives for the proposed anesthesia with the patient or authorized representative who has indicated his/her understanding and acceptance.   Dental advisory given  Plan Discussed with: CRNA  Anesthesia Plan Comments: ( )      Anesthesia Quick Evaluation

## 2017-04-16 NOTE — Brief Op Note (Signed)
04/16/2017  3:06 PM  PATIENT:  Rebecca Houston  71 y.o. 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, radiculopathy, cervicalgia C 45, C 56, C 67 levels  PROCEDURE:  Procedure(s) with comments: Cervical Four-Five, Cervical Five-Six, Cervical Six-Seven  Anterior cervical decompression/discectomy, fusion (N/A) - C4-5 C5-6 C6-7 Anterior cervical decompression/discectomy, fusion with PEEK cages, autograft, plate  SURGEON:  Surgeon(s) and Role:    Erline Levine, MD - Primary    Earnie Larsson, MD - Assisting  PHYSICIAN ASSISTANT:   ASSISTANTS: Poteat, RN   ANESTHESIA:   general  EBL:  100 mL   BLOOD ADMINISTERED:none  DRAINS: (10) Jackson-Pratt drain(s) with closed bulb suction in the prevertebral space   LOCAL MEDICATIONS USED:  MARCAINE    and LIDOCAINE   SPECIMEN:  No Specimen  DISPOSITION OF SPECIMEN:  N/A  COUNTS:  YES  TOURNIQUET:  * No tourniquets in log *  DICTATION: Patient was brought to operating room and following the smooth and uncomplicated induction of general endotracheal anesthesia her head was placed on a horseshoe head holder he was placed in 5 pounds of Holter traction and her anterior neck was prepped and draped in usual sterile fashion. An incision was made on the left side of midline after infiltrating the skin and subcutaneous tissues with local lidocaine. The platysmal layer was incised and subplatysmal dissection was performed exposing the anterior border sternocleidomastoid muscle. Using blunt dissection the carotid sheath was kept lateral and trachea and esophagus kept medial exposing the anterior cervical spine. A bent spinal needle was placed it was felt to be the C4-5 level and this was confirmed on intraoperative x-ray. Longus coli muscles were taken down from  the anterior cervical spine using electrocautery and key elevator and self-retaining retractor was placed. The interspace at C6-7 was incised and a thorough discectomy was performed. Distraction pins were placed. Uncinate spurs and central spondylitic ridges were drilled down with a high-speed drill. The spinal cord dura and both C7 nerve roots were widely decompressed. Hemostasis was assured. After trial sizing a 6 mm large footprint peek interbody cage was selected and packed with local autograft and DBM. The graft was tamped into position and countersunk appropriately. The retractor was moved and the interspace at C5-6 was incised and a thorough discectomy was performed. Distraction pins were placed. Uncinate spurs and central spondylitic ridges were drilled down with a high-speed drill. The spinal cord dura and both C6 nerve roots were widely decompressed. Hemostasis was assured. After trial sizing a 6 mm peek interbody cage was selected and packed in a similar fashion. The graft was tamped into position and countersunk appropriately.The interspace at C4-5 was incised and a thorough discectomy was performed. Distraction pins were placed. Uncinate spurs and central spondylitic ridges were drilled down with a high-speed drill. The spinal cord dura and both C5 nerve roots were widely decompressed. A large disc herniation was removed on the right with decompression of the right C5 nerve root. Hemostasis was assured. After trial sizing a 6 mm peek interbody cage was selected and packed in a similar fashion. The graft was tamped into position and countersunk appropriately.  Distraction weight was removed. A 48 mm Aviator anterior cervical plate was affixed to the cervical spine with 14 mm variable-angle screws 2 at C4, 2 at C5, 2 at C6,  and 2 at C7. All screws were well-positioned and locking mechanisms were engaged. Soft tissues were inspected and found to be in good repair. The wound was irrigated. A final x-ray  was obtained with good visualization at C4 with the interbody graft well visualized. A # 10 JP drain was inserted through a separate stab incision.  The platysma layer was closed with 3-0 Vicryl stitches and the skin was reapproximated with 3-0 Vicryl subcuticular stitches. The wound was dressed with Dermabond and an occlusive dressing. Counts were correct at the end of the case. Patient was extubated and taken to recovery in stable and satisfactory condition.    PLAN OF CARE: Admit to inpatient   PATIENT DISPOSITION:  PACU - hemodynamically stable.   Delay start of Pharmacological VTE agent (>24hrs) due to surgical blood loss or risk of bleeding: yes

## 2017-04-16 NOTE — Interval H&P Note (Signed)
History and Physical Interval Note:  04/16/2017 12:07 PM  Rebecca Houston  has presented today for surgery, with the diagnosis of Cervical stenosis of spinal canal  The various methods of treatment have been discussed with the patient and family. After consideration of risks, benefits and other options for treatment, the patient has consented to  Procedure(s) with comments: C4-5 C5-6 C6-7 Anterior cervical decompression/discectomy, fusion (N/A) - C4-5 C5-6 C6-7 Anterior cervical decompression/discectomy, fusion as a surgical intervention .  The patient's history has been reviewed, patient examined, no change in status, stable for surgery.  I have reviewed the patient's chart and labs.  Questions were answered to the patient's satisfaction.     Angie Piercey D

## 2017-04-16 NOTE — Progress Notes (Signed)
Awake, alert, conversant.  Pain much improved.  Strength full bilateral D/B/T/HI.  MAEW.  Doing well.

## 2017-04-16 NOTE — Anesthesia Procedure Notes (Signed)
Procedure Name: Intubation Date/Time: 04/16/2017 12:32 PM Performed by: Neldon Newport, CRNA Pre-anesthesia Checklist: Timeout performed, Patient being monitored, Emergency Drugs available, Suction available and Patient identified Patient Re-evaluated:Patient Re-evaluated prior to induction Oxygen Delivery Method: Circle system utilized Preoxygenation: Pre-oxygenation with 100% oxygen Induction Type: IV induction Ventilation: Mask ventilation without difficulty Laryngoscope Size: Mac and 3 Grade View: Grade II Tube type: Oral Tube size: 7.0 mm Number of attempts: 1 Placement Confirmation: breath sounds checked- equal and bilateral,  positive ETCO2 and ETT inserted through vocal cords under direct vision Secured at: 21 cm Tube secured with: Tape Dental Injury: Teeth and Oropharynx as per pre-operative assessment

## 2017-04-16 NOTE — Transfer of Care (Signed)
Immediate Anesthesia Transfer of Care Note  Patient: Rebecca Houston  Procedure(s) Performed: Cervical Four-Five, Cervical Five-Six, Cervical Six-Seven  Anterior cervical decompression/discectomy, fusion (N/A Spine Cervical)  Patient Location: PACU  Anesthesia Type:General  Level of Consciousness: oriented, drowsy and patient cooperative  Airway & Oxygen Therapy: Patient Spontanous Breathing and Patient connected to nasal cannula oxygen  Post-op Assessment: Report given to RN and Post -op Vital signs reviewed and stable  Post vital signs: Reviewed and stable  Last Vitals:  Vitals:   04/16/17 1049 04/16/17 1518  BP: (!) 174/80   Pulse: (!) 56   Resp: 20   Temp: 36.4 C 36.8 C  SpO2: 99%     Last Pain:  Vitals:   04/16/17 1518  TempSrc:   PainSc: (P) Asleep         Complications: No apparent anesthesia complications

## 2017-04-16 NOTE — Anesthesia Postprocedure Evaluation (Signed)
Anesthesia Post Note  Patient: Rebecca Houston  Procedure(s) Performed: Cervical Four-Five, Cervical Five-Six, Cervical Six-Seven  Anterior cervical decompression/discectomy, fusion (N/A Spine Cervical)     Patient location during evaluation: PACU Anesthesia Type: General Level of consciousness: awake and alert Pain management: pain level controlled Vital Signs Assessment: post-procedure vital signs reviewed and stable Respiratory status: spontaneous breathing, nonlabored ventilation, respiratory function stable and patient connected to nasal cannula oxygen Cardiovascular status: blood pressure returned to baseline and stable Postop Assessment: no apparent nausea or vomiting Anesthetic complications: no    Last Vitals:  Vitals:   04/16/17 1630 04/16/17 1633  BP:  (!) 161/74  Pulse:  77  Resp:  13  Temp: 36.6 C   SpO2:  99%    Last Pain:  Vitals:   04/16/17 1621  TempSrc:   PainSc: [name]

## 2017-04-16 NOTE — Progress Notes (Signed)
Per Dr. Fransisco Beau, no new PT/INR drawn DOS

## 2017-04-17 ENCOUNTER — Encounter (HOSPITAL_COMMUNITY): Payer: Self-pay | Admitting: Neurosurgery

## 2017-04-17 DIAGNOSIS — M50121 Cervical disc disorder at C4-C5 level with radiculopathy: Secondary | ICD-10-CM | POA: Diagnosis present

## 2017-04-17 DIAGNOSIS — M4802 Spinal stenosis, cervical region: Secondary | ICD-10-CM | POA: Diagnosis present

## 2017-04-17 DIAGNOSIS — I4891 Unspecified atrial fibrillation: Secondary | ICD-10-CM | POA: Diagnosis present

## 2017-04-17 DIAGNOSIS — M5020 Other cervical disc displacement, unspecified cervical region: Secondary | ICD-10-CM | POA: Diagnosis present

## 2017-04-17 DIAGNOSIS — M4722 Other spondylosis with radiculopathy, cervical region: Secondary | ICD-10-CM | POA: Diagnosis present

## 2017-04-17 DIAGNOSIS — K219 Gastro-esophageal reflux disease without esophagitis: Secondary | ICD-10-CM | POA: Diagnosis present

## 2017-04-17 DIAGNOSIS — Z7901 Long term (current) use of anticoagulants: Secondary | ICD-10-CM | POA: Diagnosis not present

## 2017-04-17 DIAGNOSIS — I10 Essential (primary) hypertension: Secondary | ICD-10-CM | POA: Diagnosis present

## 2017-04-17 DIAGNOSIS — M4602 Spinal enthesopathy, cervical region: Secondary | ICD-10-CM | POA: Diagnosis present

## 2017-04-17 MED ORDER — HYDROCODONE-ACETAMINOPHEN 5-325 MG PO TABS: 1.0000 | ORAL_TABLET | ORAL | 0 refills | Status: DC | PRN

## 2017-04-17 MED ORDER — METHOCARBAMOL 500 MG PO TABS: 500.0000 mg | ORAL_TABLET | Freq: Four times a day (QID) | ORAL | 1 refills | Status: DC | PRN

## 2017-04-17 MED FILL — Thrombin For Soln 20000 Unit: CUTANEOUS | Qty: 1 | Status: AC

## 2017-04-17 MED FILL — Thrombin For Soln 5000 Unit: CUTANEOUS | Qty: 5000 | Status: AC

## 2017-04-17 NOTE — Progress Notes (Signed)
Patient alert and oriented, mae's well, voiding adequate amount of urine, swallowing without difficulty, no c/o pain at time of discharge. Patient discharged home with family. Script and discharged instructions given to patient. Patient and family stated understanding of instructions given. Patient has an appointment with Dr.Stern    

## 2017-04-17 NOTE — Progress Notes (Addendum)
Subjective: Patient reports "I feel good...a little sore like you said I would"  Objective: Vital signs in last 24 hours: Temp:  [97.6 F (36.4 C)-98.4 F (36.9 C)] 98.4 F (36.9 C) (03/06 0330) Pulse Rate:  [56-77] 59 (03/06 0330) Resp:  [11-20] 17 (03/06 0330) BP: (139-175)/(69-84) 152/69 (03/06 0330) SpO2:  [90 %-99 %] 96 % (03/06 0330) Weight:  [87.1 kg (192 lb)] 87.1 kg (192 lb) (03/05 1049)  Intake/Output from previous day: 03/05 0701 - 03/06 0700 In: 1240 [P.O.:240; I.V.:1000] Out: 135 [Drains:35; Blood:100] Intake/Output this shift: No intake/output data recorded.  Alert, conversant. Husband present. Incision flat without erythema or drainage. JP site with dsd, dry. Full strength BUE and hand intrinsics. No dysphagia. mild posterior cervical/shoulder soreness.   Lab Results: No results for input(s): WBC, HGB, HCT, PLT in the last 72 hours. BMET No results for input(s): NA, K, CL, CO2, GLUCOSE, BUN, CREATININE, CALCIUM in the last 72 hours.  Studies/Results: Dg Cervical Spine 2-3 Views  Result Date: 04/16/2017 CLINICAL DATA:  Cervical fusion EXAM: CERVICAL SPINE - 2-3 VIEW COMPARISON:  03/27/2017 FINDINGS: ACDF C4 through C6. C6-7 not well visualized due to overlying shoulders. Normal alignment. IMPRESSION: ACDF C4-5 and C5-6. C6-7 not adequately visualized due to shoulders. Follow-up upright x-ray suggested for further evaluation of the extent of fusion. Electronically Signed   By: Franchot Gallo M.D.   On: 04/16/2017 15:13    Assessment/Plan: Improved   LOS: 1 day  Per Dr. Vertell Limber, d/c to home. Pt verbalizes understanding of d/c instructions. She has f/u appt scheduled. She has Norco at home for prn use. Robaxin 500mg  will be eRxed from office to pts pharmacy for spasms.   Verdis Prime 04/17/2017, 7:30 AM  Patient is doing well.  Discharge home.

## 2017-04-17 NOTE — Discharge Summary (Signed)
Physician Discharge Summary  Patient ID: Laporche Martelle MRN: [id number] DOB/AGE: 1945/09/14 72 y.o.  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 Following uncomplicated ACDF A2-1, 5-6, 6-7, she recovered nicely and transferred to [facility]. She is mobilizing well.   Consults: None  Significant Diagnostic Studies: radiology: X-Ray intra-op   Treatments: surgery: Cervical Four-Five, Cervical Five-Six, Cervical Six-Seven Anterior cervical decompression/discectomy, fusion (N/A) - C4-5 C5-6 C6-7 Anterior cervical decompression/discectomy, fusion with PEEK cages, autograft, plate    Discharge Exam: Blood pressure (!) 152/69, pulse (!) 59, temperature 98.4 F (36.9 C), temperature source Oral, resp. rate 17, height 5\' 7"  (1.702 m), weight 87.1 kg (192 lb), SpO2 96 %. Alert, conversant. Husband present. Incision flat without erythema or drainage. JP site with dsd, dry. Full strength BUE and hand intrinsics. No dysphagia. mild posterior cervical/shoulder soreness.    Disposition: 01-Home or Self Care  Pt verbalizes understanding of d/c instructions. She has f/u appt scheduled. She has Norco at home for prn use. Robaxin 500mg  will be eRxed from office to pts pharmacy for spasms.      Allergies as of  04/17/2017      Reactions   No Known Allergies       Medication List    TAKE these medications   ALPRAZolam 0.5 MG tablet Commonly known as:  XANAX Take 0.5 mg by mouth 2 (two) times daily as needed for anxiety.   busPIRone 15 MG tablet Commonly known as:  BUSPAR Take 15 mg by mouth 2 (two) times daily.   carvedilol 12.5 MG tablet Commonly known as:  COREG Take 12.5 mg by mouth 2 (two) times daily with a meal.   escitalopram 20 MG tablet Commonly known as:  LEXAPRO Take 20 mg by mouth every evening.   HYDROcodone-acetaminophen 5-325 MG tablet Commonly known as:  NORCO/VICODIN Take 1 tablet by mouth every 4 (four) hours as needed for moderate pain. What changed:  Another medication with the same name was added. Make sure you understand how and when to take each.   HYDROcodone-acetaminophen 5-325 MG tablet Commonly known as:  NORCO/VICODIN Take 1 tablet by mouth every 4 (four) hours as needed for moderate pain. What changed:  You were already taking a medication with the same name, and this prescription was added. Make sure you understand how and when to take each.   lisinopril 20 MG tablet Commonly known as:  PRINIVIL,ZESTRIL Take 20 mg by mouth 2 (two) times daily.   methocarbamol 500 MG tablet Commonly known as:  ROBAXIN Take 1 tablet (500 mg total) by mouth every 6 (six) hours as needed for muscle spasms.   omeprazole 20 MG capsule Commonly known as:  PRILOSEC Take 20 mg by mouth every other day.   rivaroxaban 20 MG Tabs tablet Commonly known as:  XARELTO Take 20 mg by mouth daily with supper.   SYSTANE BALANCE 0.6 % Soln Generic drug:  Propylene Glycol Place 1  drop into both eyes as needed (dry eyes).        Signed: Peggyann Shoals, MD 04/17/2017, 8:07 AM

## 2017-04-17 NOTE — Evaluation (Signed)
Physical Therapy Evaluation Patient Details Name: Rebecca Houston MRN: [id number] DOB: 02/21/1945 Today's Date: 04/17/2017   History of Present Illness  Pt is a 72 y.o. female with significant PMH of malignant melanoma, admitted s/p C4-7 ACDF.  Clinical Impression  Patient is s/p above surgery resulting in the deficits listed below (see PT Problem List). Patient main deficits is diminished balance from baseline, tending to intermittently use railings and walls in the hallway during ambulation for stability. Prior to surgery, patient was independent with community mobility without a device. Patient will benefit from skilled PT to increase their independence and safety with mobility (while adhering to their precautions) to allow discharge to the venue listed below.    Follow Up Recommendations No PT follow up    Equipment Recommendations  None recommended by PT    Recommendations for Other Services       Precautions / Restrictions Precautions Precautions: Fall;Cervical Precaution Booklet Issued: Yes (comment) Required Braces or Orthoses: Cervical Brace Cervical Brace: Hard collar Restrictions Weight Bearing Restrictions: No      Mobility  Bed Mobility               General bed mobility comments: Patient sitting on EOB upon PT arrival  Transfers Overall transfer level: Modified independent Equipment used: None                Ambulation/Gait Ambulation/Gait assistance: Supervision Ambulation Distance (Feet): 350 Feet Assistive device: None Gait Pattern/deviations: Step-through pattern;Decreased stride length   Gait velocity interpretation: Below normal speed for age/gender General Gait Details: Patient requiring supervision for safety during ambulation. Tends to hold onto rails/walls intermittently for balance. Displays LLE external rotation, decreased stride length, and diminished bilateral foot clearance.   Stairs Stairs: Yes Stairs assistance: Min  guard Stair Management: Two rails;Forwards Number of Stairs: 2 General stair comments: Step by step pattern.  Wheelchair Mobility    Modified Rankin (Stroke Patients Only)       Balance Overall balance assessment: Needs assistance Sitting-balance support: No upper extremity supported;Feet supported Sitting balance-Leahy Scale: Good     Standing balance support: No upper extremity supported Standing balance-Leahy Scale: Fair                               Pertinent Vitals/Pain Pain Assessment: Faces Faces Pain Scale: Hurts a little bit Pain Location: Bilateral shoulders Pain Descriptors / Indicators: Sore Pain Intervention(s): Monitored during session    Home Living Family/patient expects to be discharged to:: Private residence Living Arrangements: Spouse/significant other Available Help at Discharge: Family Type of Home: House Home Access: Stairs to enter Entrance Stairs-Rails: Can reach both Entrance Stairs-Number of Steps: 2 Home Layout: One level Home Equipment: Shower seat      Prior Function Level of Independence: Independent         Comments: Patient is retired and states she is independent with community ambulation without a device.     Hand Dominance        Extremity/Trunk Assessment   Upper Extremity Assessment Upper Extremity Assessment: Defer to OT evaluation    Lower Extremity Assessment Lower Extremity Assessment: Overall WFL for tasks assessed    Cervical / Trunk Assessment Cervical / Trunk Assessment: Normal  Communication   Communication: No difficulties  Cognition Arousal/Alertness: Awake/alert Behavior During Therapy: WFL for tasks assessed/performed Overall Cognitive Status: Within Functional Limits for tasks assessed  General Comments General comments (skin integrity, edema, etc.): Patient husband present throughout session. Patient and patient husband  provided education on car transfer, rotating trunk to scan environment for obstacles and crossing the street, bed positioning, and proper brace alignment.     Exercises     Assessment/Plan    PT Assessment Patient needs continued PT services  PT Problem List Decreased balance;Decreased mobility       PT Treatment Interventions Gait training;Stair training;Functional mobility training;Therapeutic activities;Therapeutic exercise;Balance training;Patient/family education    PT Goals (Current goals can be found in the Care Plan section)  Acute Rehab PT Goals Patient Stated Goal: Be independent PT Goal Formulation: With patient Time For Goal Achievement: 04/21/17 Potential to Achieve Goals: Good    Frequency Min 5X/week   Barriers to discharge        Co-evaluation               AM-PAC PT "6 Clicks" Daily Activity  Outcome Measure Difficulty turning over in bed (including adjusting bedclothes, sheets and blankets)?: A Little Difficulty moving from lying on back to sitting on the side of the bed? : A Little Difficulty sitting down on and standing up from a chair with arms (e.g., wheelchair, bedside commode, etc,.)?: A Little Help needed moving to and from a bed to chair (including a wheelchair)?: A Little Help needed walking in hospital room?: A Little Help needed climbing 3-5 steps with a railing? : A Little 6 Click Score: 18    End of Session Equipment Utilized During Treatment: Gait belt Activity Tolerance: Patient tolerated treatment well Patient left: in bed;with call bell/phone within reach;with family/visitor present Nurse Communication: Mobility status PT Visit Diagnosis: Unsteadiness on feet (R26.81)    Time: 2706-2376 PT Time Calculation (min) (ACUTE ONLY): 20 min   Charges:   PT Evaluation $PT Eval Low Complexity: 1 Low     PT G Codes:       Ellamae Sia, PT, DPT Acute Rehabilitation Services    Willy Eddy 04/17/2017, 9:35 AM

## 2017-04-17 NOTE — Evaluation (Addendum)
Occupational Therapy Evaluation and Discharge Patient Details Name: Rebecca Houston MRN: [id number] DOB: 06/03/1945 Today's Date: 04/17/2017    History of Present Illness Pt is a 72 y.o. female with significant PMH of malignant melanoma, admitted s/p C4-7 ACDF.   Clinical Impression   PTA, pt was independent with ADL and functional mobility. She currently requires overall supervision for safety with ADL and ADL transfers due to slight instability. She also reports L shoulder aching pain today. Pt and husband educated concerning safe compensatory strategies for dressing, bathing, and grooming tasks as well as safe home set-up for maximizing adherence to cervical precautions. Additionally discussed brace wear, methods to don/doff brace, and methods for cleaning brace padding and replacing. Pt and husband verbalize and demonstrate understanding. No further OT needs identified and OT will sign off.     Follow Up Recommendations  No OT follow up;Supervision/Assistance - 24 hour    Equipment Recommendations  None recommended by OT    Recommendations for Other Services       Precautions / Restrictions Precautions Precautions: Fall;Cervical Precaution Booklet Issued: Yes (comment) Precaution Comments: Provided by PT. Reviewed ADL Required Braces or Orthoses: Cervical Brace Cervical Brace: Hard collar Restrictions Weight Bearing Restrictions: No      Mobility Bed Mobility Overal bed mobility: Needs Assistance Bed Mobility: Rolling;Sit to Sidelying;Sidelying to Sit Rolling: Supervision Sidelying to sit: Supervision     Sit to sidelying: Supervision General bed mobility comments: Supervision for safety with ADL participation.   Transfers Overall transfer level: Modified independent Equipment used: None                  Balance Overall balance assessment: Needs assistance Sitting-balance support: No upper extremity supported;Feet supported Sitting balance-Leahy Scale: Good     Standing balance support: No upper extremity supported Standing balance-Leahy Scale: Fair Standing balance comment: Statically able to stand without UE support.                            ADL either performed or assessed with clinical judgement   ADL Overall ADL's : Needs assistance/impaired Eating/Feeding: Modified independent   Grooming: Supervision/safety;Standing   Upper Body Bathing: Supervision/ safety;Sitting   Lower Body Bathing: Supervison/ safety;Sit to/from stand   Upper Body Dressing : Supervision/safety;Sitting   Lower Body Dressing: Supervision/safety;Sit to/from stand   Toilet Transfer: Supervision/safety;Ambulation   Toileting- Clothing Manipulation and Hygiene: Supervision/safety;Sit to/from stand   Tub/ Shower Transfer: Supervision/safety;Ambulation   Functional mobility during ADLs: Supervision/safety General ADL Comments: Educated pt and husband concerning compensatory ADL strategies to adhere to cervical precautions.      Vision Patient Visual Report: No change from baseline Vision Assessment?: No apparent visual deficits     Perception     Praxis      Pertinent Vitals/Pain Pain Assessment: Faces Faces Pain Scale: Hurts a little bit Pain Location: Bilateral shoulders Pain Descriptors / Indicators: Sore Pain Intervention(s): Monitored during session     Hand Dominance     Extremity/Trunk Assessment Upper Extremity Assessment Upper Extremity Assessment: Overall WFL for tasks assessed;LUE deficits/detail LUE Deficits / Details: Shoulder feels "achy"   Lower Extremity Assessment Lower Extremity Assessment: Overall WFL for tasks assessed   Cervical / Trunk Assessment Cervical / Trunk Assessment: Normal   Communication Communication Communication: No difficulties   Cognition Arousal/Alertness: Awake/alert Behavior During Therapy: WFL for tasks assessed/performed Overall Cognitive Status: Within Functional Limits for tasks  assessed  General Comments  Educated pt and husband concerning brace wear, cleaning, and methods to don/doff    Exercises     Shoulder Instructions      Home Living Family/patient expects to be discharged to:: Private residence Living Arrangements: Spouse/significant other Available Help at Discharge: Family Type of Home: House Home Access: Stairs to enter Technical brewer of Steps: 2 Entrance Stairs-Rails: Can reach both Home Layout: One level     Bathroom Shower/Tub: Occupational psychologist: Handicapped height     Home Equipment: Shower seat          Prior Functioning/Environment Level of Independence: Independent        Comments: Patient is retired and states she is independent with community ambulation without a device.        OT Problem List: Decreased strength;Decreased range of motion;Decreased activity tolerance;Impaired balance (sitting and/or standing);Decreased safety awareness;Decreased knowledge of use of DME or AE;Decreased knowledge of precautions;Pain      OT Treatment/Interventions:      OT Goals(Current goals can be found in the care plan section) Acute Rehab OT Goals Patient Stated Goal: Be independent OT Goal Formulation: With patient/family  OT Frequency:     Barriers to D/C:            Co-evaluation              AM-PAC PT "6 Clicks" Daily Activity     Outcome Measure Help from another person eating meals?: None Help from another person taking care of personal grooming?: A Little Help from another person toileting, which includes using toliet, bedpan, or urinal?: A Little Help from another person bathing (including washing, rinsing, drying)?: A Little Help from another person to put on and taking off regular upper body clothing?: A Little Help from another person to put on and taking off regular lower body clothing?: A Little 6 Click Score: 19   End of  Session Nurse Communication: Mobility status  Activity Tolerance: Patient tolerated treatment well Patient left: in bed;with call bell/phone within reach;with family/visitor present(seated at EOB)  OT Visit Diagnosis: Other abnormalities of gait and mobility (R26.89);Pain Pain - Right/Left: Left Pain - part of body: Shoulder(cervical)                Time: 0623-7628 OT Time Calculation (min): 15 min Charges:  OT General Charges $OT Visit: 1 Visit OT Evaluation $OT Eval Low Complexity: 1 Low G-Codes:     Norman Herrlich, MS OTR/L  Pager: [place] A Rebecca Houston 04/17/2017, 10:06 AM

## 2017-04-17 NOTE — Discharge Instructions (Signed)

## 2017-09-27 IMAGING — CR DG OR LOCAL ABDOMEN
1 series · 1 of 1 positions shown · non-contrast
Comparison: None.

CLINICAL DATA: Anterior fusion.  No instrument count

EXAM:
OR LOCAL ABDOMEN

[AP]
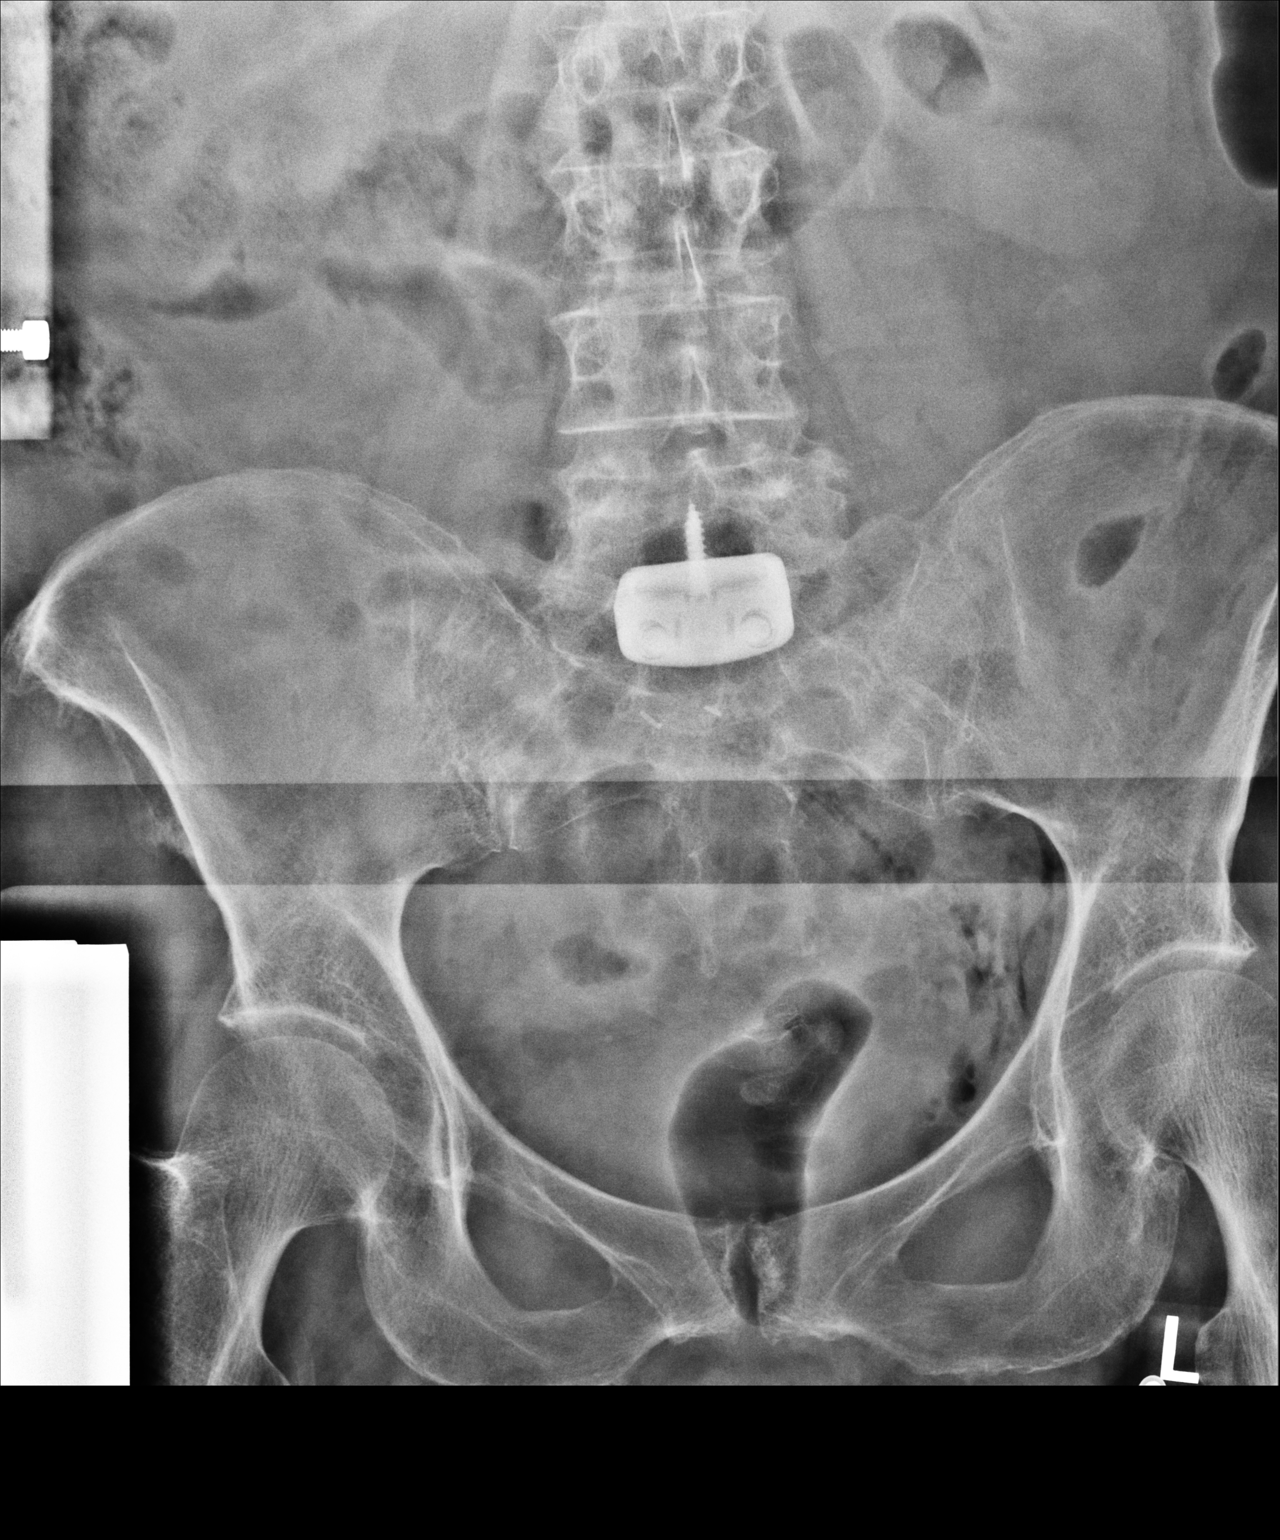

[1 of 1 positions shown; findings below may reference images not displayed]

FINDINGS: There is postoperative change at L5-S1. Bowel gas pattern is normal.
No retained needle or instrument evident.
IMPRESSION: Postoperative change L5-S1. Normal gas pattern. No retained
instrument or needle.

Critical Value/emergent results were called by telephone at the time
of interpretation on 04/02/2016 at [DATE] to OR circulating nurse,
who verbally acknowledged these results.

## 2018-01-12 DIAGNOSIS — J849 Interstitial pulmonary disease, unspecified: Secondary | ICD-10-CM

## 2018-01-12 HISTORY — DX: Interstitial pulmonary disease, unspecified: J84.9

## 2018-07-18 ENCOUNTER — Ambulatory Visit: Payer: Self-pay | Admitting: Surgery

## 2018-08-31 ENCOUNTER — Encounter (HOSPITAL_COMMUNITY): Payer: Self-pay | Admitting: Surgery

## 2018-08-31 DIAGNOSIS — K828 Other specified diseases of gallbladder: Secondary | ICD-10-CM | POA: Diagnosis present

## 2018-08-31 DIAGNOSIS — R109 Unspecified abdominal pain: Secondary | ICD-10-CM | POA: Diagnosis present

## 2018-08-31 NOTE — H&P (Signed)
General Surgery [facility]   Charise Killian DOB: [birth year] Married / Language: English / Race: White Female   History of Present Illness   The patient is a 73 year old female who presents for evaluation of gall stones.  CHIEF COMPLAINT: biliary dyskinesia, abdominal pain  Patient is referred by Etter Sjogren, FNP-C, for surgical evaluation and management of biliary dyskinesia and abdominal pain. Patient has a long-standing history of intermittent epigastric abdominal pain radiating into the chest and into the back associated with moderate belching lasting for approximately 1 hour. Patient has undergone ultrasound examination on more than one occasion and has never had gallstones demonstrated. Her most recent episode lasted approximately 1 hour. Episodes are relieved by drinking milk. Patient was referred for nuclear medicine hepatobiliary scan which was performed in [place], [place], on March 24, 2018. This demonstrated a very low gallbladder ejection fraction of 10%, consistent with biliary dyskinesia. Patient is referred at this time for consideration for cholecystectomy. Patient has no prior history of hepatobiliary or pancreatic disease. She denies jaundice or acholic stools. Her only abdominal procedure was a preperitoneal approach to the lumbar spine for back surgery. There is a family history of gallbladder disease and the patient's sister. Patient presents today to discuss cholecystectomy.   Past Surgical History  Cataract Surgery  Bilateral. Hysterectomy (not due to cancer) - Partial  Mammoplasty; Reduction  Bilateral. Spinal Surgery - Lower Back  Spinal Surgery - Neck   Diagnostic Studies History  Mammogram  within last year Pap Smear  1-5 years ago  Allergies  No Known Drug Allergies  [05/05/2018]: Allergies Reconciled   Medication History ALPRAZolam (0.5MG  Tablet, Oral) Active. busPIRone HCl (15MG  Tablet, Oral)  Active. Carvedilol (12.5MG  Tablet, Oral) Active. Escitalopram Oxalate (20MG  Tablet, Oral) Active. Lisinopril (20MG  Tablet, Oral) Active. Xarelto (20MG  Tablet, Oral) Active. hydroCHLOROthiazide (12.5MG  Capsule, Oral) Active. Omeprazole (20MG  Tablet DR, Oral) Active. Medications Reconciled  Social History Caffeine use  Carbonated beverages, Tea. No alcohol use  No drug use  Tobacco use  Former smoker.  Family History Arthritis  Mother. Breast Cancer  Sister. Cancer  Sister, Son. Depression  Sister, Son. Heart Disease  Father. Hypertension  Mother, Sister.  Pregnancy / Birth History Age at menarche  48 years. Age of menopause  [age] Contraceptive History  Oral contraceptives. Gravida  3 Maternal age  13-20 Para  3  Other Problems Anxiety Disorder  Arthritis  Atrial Fibrillation  Back Pain  High blood pressure  Kidney Stone  Melanoma   Review of Systems General Not Present- Appetite Loss, Chills, Fatigue, Fever, Night Sweats, Weight Gain and Weight Loss. Skin Not Present- Change in Wart/Mole, Dryness, Hives, Jaundice, New Lesions, Non-Healing Wounds, Rash and Ulcer. HEENT Not Present- Earache, Hearing Loss, Hoarseness, Nose Bleed, Oral Ulcers, Ringing in the Ears, Seasonal Allergies, Sinus Pain, Sore Throat, Visual Disturbances, Wears glasses/contact lenses and Yellow Eyes. Respiratory Present- Snoring. Not Present- Bloody sputum, Chronic Cough, Difficulty Breathing and Wheezing. Breast Not Present- Breast Mass, Breast Pain, Nipple Discharge and Skin Changes. Cardiovascular Not Present- Chest Pain, Difficulty Breathing Lying Down, Leg Cramps, Palpitations, Rapid Heart Rate, Shortness of Breath and Swelling of Extremities. Gastrointestinal Not Present- Abdominal Pain, Bloating, Bloody Stool, Change in Bowel Habits, Chronic diarrhea, Constipation, Difficulty Swallowing, Excessive gas, Gets full quickly at meals, Hemorrhoids, Indigestion, Nausea,  Rectal Pain and Vomiting. Female Genitourinary Not Present- Frequency, Nocturia, Painful Urination, Pelvic Pain and Urgency. Musculoskeletal Present- Back Pain, Joint Pain and Joint Stiffness. Not Present- Muscle Pain, Muscle  Weakness and Swelling of Extremities. Neurological Not Present- Decreased Memory, Fainting, Headaches, Numbness, Seizures, Tingling, Tremor, Trouble walking and Weakness. Psychiatric Present- Anxiety and Fearful. Not Present- Bipolar, Change in Sleep Pattern, Depression and Frequent crying. Endocrine Present- Hot flashes. Not Present- Cold Intolerance, Excessive Hunger, Hair Changes, Heat Intolerance and New Diabetes. Hematology Present- Blood Thinners. Not Present- Easy Bruising, Excessive bleeding, Gland problems, HIV and Persistent Infections.  Vitals Weight: 200 lb Height: 66in Body Surface Area: 2 m Body Mass Index: 32.28 kg/m  Temp.: 97.75F  Pulse: 72 (Regular)  BP: 140/78(Sitting, Left Arm, Standard)  Physical Exam   See vital signs recorded above  GENERAL APPEARANCE Development: normal Nutritional status: normal Gross deformities: none  SKIN Rash, lesions, ulcers: none Induration, erythema: none Nodules: none palpable  EYES Conjunctiva and lids: normal Pupils: equal and reactive Iris: normal bilaterally  EARS, NOSE, MOUTH, THROAT External ears: no lesion or deformity External nose: no lesion or deformity Hearing: grossly normal Lips: no lesion or deformity Dentition: normal for age Oral mucosa: moist  NECK Symmetric: yes Trachea: midline Thyroid: no palpable nodules in the thyroid bed  CHEST Respiratory effort: normal Retraction or accessory muscle use: no Breath sounds: normal bilaterally Rales, rhonchi, wheeze: none  CARDIOVASCULAR Auscultation: regular rhythm, normal rate Murmurs: none Pulses: carotid and radial pulse 2+ palpable Lower extremity edema: none Lower extremity varicosities:  none  ABDOMEN Distension: none Masses: none palpable Tenderness: none Hepatosplenomegaly: not present Hernia: not present  MUSCULOSKELETAL Station and gait: normal Digits and nails: no clubbing or cyanosis Muscle strength: grossly normal all extremities Range of motion: grossly normal all extremities Deformity: none  LYMPHATIC Cervical: none palpable Supraclavicular: none palpable  PSYCHIATRIC Oriented to person, place, and time: yes Mood and affect: normal for situation Judgment and insight: appropriate for situation    Assessment & Plan  BILIARY DYSKINESIA (K82.8)   Pt Education - Pamphlet Given - Laparoscopic Gallbladder Surgery: discussed with patient and provided information.  Patient presents on referral from her primary care provider for evaluation of abdominal pain and biliary dyskinesia. Patient is provided with written literature on gallbladder surgery to review at home.  Patient has had intermittent episodes of epigastric abdominal pain radiating to the back associated with belching for a number of years. Previous ultrasound examinations failed to identify gallstones. Nuclear medicine parathyroid scan from February 2020 indicates biliary dyskinesia. The patient does have a family history of gallbladder disease in her sister.  I recommended proceeding with laparoscopic cholecystectomy. We discussed the procedure. We discussed performing intraoperative cholangiography to evaluate the bile ducts. We discussed the hospital stay to be anticipated and her postoperative recovery. We discussed the small chance of conversion to open surgery. Patient understands and wishes to proceed with gallbladder surgery in the near future.  At the present time however, we are unable to post elective surgeries due to the current medical climate. As soon as things resolve with the virus pandemic, we will arrange for this procedure to be placed on the schedule at a time convenient  for the patient.  The risks and benefits of the procedure have been discussed at length with the patient. The patient understands the proposed procedure, potential alternative treatments, and the course of recovery to be expected. All of the patient's questions have been answered at this time. The patient wishes to proceed with surgery.   Armandina Gemma, [facility] Office: [phone]

## 2018-09-01 ENCOUNTER — Other Ambulatory Visit (HOSPITAL_COMMUNITY)
Admission: RE | Admit: 2018-09-01 | Discharge: 2018-09-01 | Disposition: A | Discharge status: Home / Self Care | Payer: Medicare Other | Source: Ambulatory Visit | Attending: Surgery | Admitting: Surgery

## 2018-09-01 DIAGNOSIS — Z1159 Encounter for screening for other viral diseases: Secondary | ICD-10-CM | POA: Insufficient documentation

## 2018-09-01 LAB — SARS CORONAVIRUS 2 (TAT 6-24 HRS): SARS Coronavirus 2: NEGATIVE

## 2018-09-02 ENCOUNTER — Encounter (HOSPITAL_COMMUNITY): Payer: Self-pay

## 2018-09-02 NOTE — Pre-Procedure Instructions (Signed)
CT Chest  Results 02/26/2018 in care everywhere

## 2018-09-02 NOTE — Patient Instructions (Addendum)
DUE TO COVID-19 ONLY ONE VISITOR IS ALLOWED IN THE HOSPITAL AT THIS TIME   COVID SWAB TESTING COMPLETED ON:  September 02, 2018 (Must self quarantine after testing. Follow instructions on handout.)   Your procedure is scheduled on: Tomorrow, September 04, 2018   Surgery Time:  2:40PM-4:10PM   Report to [facility]  Entrance    Report to admitting at 12:30 PM   Call this number if you have problems the morning of surgery [phone]   Do not eat food :After Midnight.   May have liquids until 8:30AM day of surgery   CLEAR LIQUID DIET  Foods Allowed                                                                     Foods Excluded  Water, Black Coffee and tea, regular and decaf                             liquids that you cannot  Plain Jell-O in any flavor                                             see through such as: Fruit ices (not with fruit pulp)                                     milk, soups, orange juice  Iced Popsicles                                    All solid food Carbonated beverages, regular and diet                                    Cranberry, grape and apple juices Sports drinks like Gatorade Lightly seasoned clear broth or consume(fat free) Sugar, honey syrup  Sample Menu Breakfast                                Lunch                                     Supper Cranberry juice                    Beef broth                            Chicken broth Jell-O                                     Grape juice  Apple juice Coffee or tea                        Jell-O                                      Popsicle                                                Coffee or tea                        Coffee or tea   Brush your teeth the morning of surgery.     Take these medicines the morning of surgery with A SIP OF WATER: Buspirone, Carvedilol, Omeprazole, Alprazolam if needed. You may also bring and use your eyedrops as needed.                    You may not have any metal on your body including hair pins, jewelry, and body piercings             Do not wear make-up, lotions, powders, perfumes/cologne, or deodorant             Do not wear nail polish.  Do not shave  48 hours prior to surgery.                Do not bring valuables to the hospital. [place].   Contacts, dentures or bridgework may not be worn into surgery.   Bring small overnight bag day of surgery.    Special Instructions: Bring a copy of your healthcare power of attorney and living will documents         the day of surgery if you haven't scanned them in before.              Please read over the following fact sheets you were given:  [facility] - Preparing for Surgery Before surgery, you can play an important role.  Because skin is not sterile, your skin needs to be as free of germs as possible.  You can reduce the number of germs on your skin by washing with CHG (chlorahexidine gluconate) soap before surgery.  CHG is an antiseptic cleaner which kills germs and bonds with the skin to continue killing germs even after washing. Please DO NOT use if you have an allergy to CHG or antibacterial soaps.  If your skin becomes reddened/irritated stop using the CHG and inform your nurse when you arrive at Short Stay. Do not shave (including legs and underarms) for at least 48 hours prior to the first CHG shower.  You may shave your face/neck.  Please follow these instructions carefully:  1.  Shower with CHG Soap the night before surgery and the  morning of surgery.  2.  If you choose to wash your hair, wash your hair first as usual with your normal  shampoo.  3.  After you shampoo, rinse your hair and body thoroughly to remove the shampoo.  4.  Use CHG as you would any other liquid soap.  You can apply chg directly to the skin and wash.  Gently with a scrungie or clean washcloth.  5.   Apply the CHG Soap to your body ONLY FROM THE NECK DOWN.   Do   not use on face/ open                           Wound or open sores. Avoid contact with eyes, ears mouth and   genitals (private parts).                       Wash face,  Genitals (private parts) with your normal soap.             6.  Wash thoroughly, paying special attention to the area where your    surgery  will be performed.  7.  Thoroughly rinse your body with warm water from the neck down.  8.  DO NOT shower/wash with your normal soap after using and rinsing off the CHG Soap.                9.  Pat yourself dry with a clean towel.            10.  Wear clean pajamas.            11.  Place clean sheets on your bed the night of your first shower and do not  sleep with pets. Day of Surgery : Do not apply any lotions/deodorants the morning of surgery.  Please wear clean clothes to the hospital/surgery center.  FAILURE TO FOLLOW THESE INSTRUCTIONS MAY RESULT IN THE CANCELLATION OF YOUR SURGERY  PATIENT SIGNATURE_________________________________  NURSE SIGNATURE__________________________________  ________________________________________________________________________

## 2018-09-03 ENCOUNTER — Other Ambulatory Visit: Payer: Self-pay

## 2018-09-03 ENCOUNTER — Encounter (HOSPITAL_COMMUNITY): Payer: Self-pay

## 2018-09-03 ENCOUNTER — Encounter (HOSPITAL_COMMUNITY)
Admission: RE | Admit: 2018-09-03 | Discharge: 2018-09-03 | Disposition: A | Discharge status: Home / Self Care | Payer: Medicare Other | Source: Ambulatory Visit | Attending: Surgery | Admitting: Surgery

## 2018-09-03 DIAGNOSIS — K828 Other specified diseases of gallbladder: Secondary | ICD-10-CM | POA: Insufficient documentation

## 2018-09-03 DIAGNOSIS — Z01818 Encounter for other preprocedural examination: Secondary | ICD-10-CM | POA: Insufficient documentation

## 2018-09-03 HISTORY — DX: Basal cell carcinoma of skin, unspecified: C44.91

## 2018-09-03 HISTORY — DX: Malignant melanoma of skin, unspecified: C43.9

## 2018-09-03 HISTORY — DX: Psoriasis, unspecified: L40.9

## 2018-09-03 HISTORY — DX: Other nonspecific abnormal finding of lung field: R91.8

## 2018-09-03 LAB — CBC
HCT: 38.3 % (ref 36.0–46.0)
Hemoglobin: 12.4 g/dL (ref 12.0–15.0)
MCH: 29.1 pg (ref 26.0–34.0)
MCHC: 32.4 g/dL (ref 30.0–36.0)
MCV: 89.9 fL (ref 80.0–100.0)
Platelets: 193 10*3/uL (ref 150–400)
RBC: 4.26 MIL/uL (ref 3.87–5.11)
RDW: 13 % (ref 11.5–15.5)
WBC: 8.4 10*3/uL (ref 4.0–10.5)
nRBC: 0 % (ref 0.0–0.2)

## 2018-09-03 LAB — BASIC METABOLIC PANEL
Anion gap: 9 (ref 5–15)
BUN: 22 mg/dL (ref 8–23)
CO2: 28 mmol/L (ref 22–32)
Calcium: 8.9 mg/dL (ref 8.9–10.3)
Chloride: 103 mmol/L (ref 98–111)
Creatinine, Ser: 0.95 mg/dL (ref 0.44–1.00)
GFR calc Af Amer: >60 mL/min (ref >60)
GFR calc non Af Amer: 60 mL/min — ABNORMAL LOW (ref >60)
Glucose, Bld: 130 mg/dL — ABNORMAL HIGH (ref 70–99)
Potassium: 4 mmol/L (ref 3.5–5.1)
Sodium: 140 mmol/L (ref 135–145)

## 2018-09-03 NOTE — Anesthesia Preprocedure Evaluation (Addendum)
Anesthesia Evaluation  Patient identified by MRN, date of birth, ID band Patient awake    Reviewed: Allergy & Precautions, NPO status , Patient's Chart, lab work & pertinent test results  History of Anesthesia Complications (+) PONV and history of anesthetic complications  Airway Mallampati: III  TM Distance: <3 FB Neck ROM: Full    Dental  (+) Teeth Intact, Dental Advisory Given   Pulmonary former smoker,    breath sounds clear to auscultation       Cardiovascular hypertension, Pt. on home beta blockers and Pt. on medications + dysrhythmias Atrial Fibrillation  Rhythm:Regular Rate:Normal     Neuro/Psych Anxiety Depression    GI/Hepatic GERD  Medicated,  Endo/Other  negative endocrine ROS  Renal/GU Renal disease     Musculoskeletal  (+) Arthritis , Osteoarthritis,    Abdominal Normal abdominal exam  (+)   Peds  Hematology negative hematology ROS (+)   Anesthesia Other Findings   Reproductive/Obstetrics                            Anesthesia Physical Anesthesia Plan  ASA: III  Anesthesia Plan: General   Post-op Pain Management:    Induction: Intravenous  PONV Risk Score and Plan: 4 or greater and Ondansetron, Dexamethasone, Midazolam and Scopolamine patch - Pre-op  Airway Management Planned: Oral ETT  Additional Equipment: None  Intra-op Plan:   Post-operative Plan: Extubation in OR  Informed Consent: I have reviewed the patients History and Physical, chart, labs and discussed the procedure including the risks, benefits and alternatives for the proposed anesthesia with the patient or authorized representative who has indicated his/her understanding and acceptance.     Dental advisory given  Plan Discussed with: CRNA  Anesthesia Plan Comments: (See PAT note 09/03/2018, Konrad Felix, PA-C  COVID-19 Labs  No results for input(s): DDIMER, FERRITIN, LDH, CRP in the last  72 hours.  Lab Results - Negative on the 20th      Component                Value               Date                      [place]            09/01/2018            )       Anesthesia Quick Evaluation

## 2018-09-03 NOTE — Progress Notes (Signed)
Anesthesia Chart Review   Case: [id number] Date/Time: 09/04/18 1425   Procedure: LAPAROSCOPIC CHOLECYSTECTOMY WITH INTRAOPERATIVE CHOLANGIOGRAM (N/A )   Anesthesia type: General   Pre-op diagnosis: BILIARY DYSKINESIA, ABDOMNIAL PAIN EPIGASTRIC   Location: [facility] ROOM 05 / [facility]   Surgeon: Armandina Gemma, MD      DISCUSSION:73 y.o. 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 Armandina Gemma.   Last seen by pulmonologist, Dr. Dorris Singh, 01/23/18.  Per OV note, "pulmonary function tests show stability in her 6-minute walk test there is reassuring I do not believe the patient requires therapy at this time."  Xarelto is managed by PCP, Dr. Pat Kocher.  She was advised to hold Xarelto 2 days prior to surgery.  Last dose 09/01/2018.   Anticipate pt can proceed with planned procedure barring acute status change.   VS: BP (!) 141/68 (BP Location: Right Arm)   Pulse (!) 59   Temp 37.1 C (Oral)   Resp 18   Ht 5\' 7"  (1.702 m)   Wt 89.1 kg   SpO2 99%   BMI 30.78 kg/m   PROVIDERS: Josem Kaufmann, MD  Is PCP  Johnston Ebbs, MD is Pulmonologist  Last seen 01/23/18  Burney Gauze, MD is Cardiologist  LABS: Labs reviewed: Acceptable for surgery. (all labs ordered are listed, but only abnormal results are displayed)  Labs Reviewed  BASIC METABOLIC PANEL - Abnormal; Notable for the following components:      Result Value   Glucose, Bld 130 (*)    GFR calc non Af Amer 60 (*)    All other components within normal limits  CBC     IMAGES:   EKG: 09/03/2018 Rate 58 bpm Sinus bradycardia Possible left atrial enlargement Nonspecific ST and T wave abnormality Unchanged since last EKG  CV: Stress Echo 02/16/13 - Sinus tachycardia. The stress ECG was negative for ischemia. Left ventricular ejection fraction was normal at rest  and with stress. Baseline LVEF 60%. No LV regional wall motion abnormalitiesatrest or with stress.  Echo 02/16/13- Mild to moderate concentric LVH. EF was in the range of 55-65%. Trivial mitral regurgitation. Trivial to mild tricuspid regurgitation. Trivial pulmonic regurgitation. Past Medical History:  Diagnosis Date  . A-fib ([place])   . Anxiety   . Arthritis   . Basal cell carcinoma   . Brain tumor (benign) ([place])   . Bruises easily   . Cancer ([place])    melanoma on neck  . Depression   . Heartburn   . History of kidney stones   . Hypertension   . ILD (interstitial lung disease) ([place]) 01/2018   Early  . Lumbar foraminal stenosis    L5-S1  . Melanoma ([place])   . PONV (postoperative nausea and vomiting)   . Psoriasis   . Pulmonary nodules    Stable bilateral sub-6 mm pulmonary nodules   . Renal cyst     Past Surgical History:  Procedure Laterality Date  . ABDOMINAL EXPOSURE N/A 04/02/2016   Procedure: ABDOMINAL EXPOSURE;  Surgeon: Rosetta Posner, MD;  Location: [facility];  Service: Vascular;  Laterality: N/A;  . ABDOMINAL HYSTERECTOMY    . ANTERIOR CERVICAL DECOMP/DISCECTOMY FUSION N/A 04/16/2017   Procedure: Cervical Four-Five, Cervical Five-Six, Cervical Six-Seven  Anterior cervical decompression/discectomy, fusion;  Surgeon: Erline Levine, MD;  Location: [facility];  Service: Neurosurgery;  Laterality: N/A;  C4-5  C5-6 C6-7 Anterior cervical decompression/discectomy, fusion  . ANTERIOR LUMBAR FUSION N/A 04/02/2016   Procedure: Lumbar five-Sacral one  Anterior lumbar interbody fusion with Dr. Curt Jews;  Surgeon: Erline Levine, MD;  Location: [facility];  Service: Neurosurgery;  Laterality: N/A;  L5-S1 Anterior lumbar interbody fusion with Dr. Sherren Mocha Early  . BRAIN SURGERY    . BREAST REDUCTION SURGERY    . CATARACT EXTRACTION, BILATERAL    . COLONOSCOPY    . NECK SURGERY     bone spur    MEDICATIONS: . ALPRAZolam (XANAX) 0.5 MG tablet  . busPIRone (BUSPAR) 15 MG tablet  . carvedilol (COREG)  12.5 MG tablet  . escitalopram (LEXAPRO) 20 MG tablet  . lisinopril (PRINIVIL,ZESTRIL) 20 MG tablet  . omeprazole (PRILOSEC) 20 MG capsule  . Propylene Glycol (SYSTANE BALANCE) 0.6 % SOLN  . rivaroxaban (XARELTO) 20 MG TABS tablet   No current facility-administered medications for this encounter.     Maia Plan [facility] Pre-Surgical Testing [phone] 09/03/18  12:48 PM

## 2018-09-04 ENCOUNTER — Other Ambulatory Visit: Payer: Self-pay

## 2018-09-04 ENCOUNTER — Ambulatory Visit (HOSPITAL_COMMUNITY): Payer: Medicare Other | Admitting: Certified Registered Nurse Anesthetist

## 2018-09-04 ENCOUNTER — Inpatient Hospital Stay (HOSPITAL_COMMUNITY)
Admission: RE | Admit: 2018-09-04 | Discharge: 2018-09-06 | DRG: 419 | Disposition: A | Discharge status: Home / Self Care | Payer: Medicare Other | Attending: Surgery | Admitting: Surgery

## 2018-09-04 ENCOUNTER — Encounter (HOSPITAL_COMMUNITY)
Admission: RE | Disposition: A | Discharge status: Home / Self Care | Payer: Self-pay | Source: Home / Self Care | Attending: Surgery

## 2018-09-04 ENCOUNTER — Encounter (HOSPITAL_COMMUNITY): Payer: Self-pay

## 2018-09-04 ENCOUNTER — Ambulatory Visit (HOSPITAL_COMMUNITY): Payer: Medicare Other

## 2018-09-04 ENCOUNTER — Ambulatory Visit (HOSPITAL_COMMUNITY): Payer: Medicare Other | Admitting: Physician Assistant

## 2018-09-04 DIAGNOSIS — Z87891 Personal history of nicotine dependence: Secondary | ICD-10-CM

## 2018-09-04 DIAGNOSIS — Z79899 Other long term (current) drug therapy: Secondary | ICD-10-CM

## 2018-09-04 DIAGNOSIS — E669 Obesity, unspecified: Secondary | ICD-10-CM | POA: Diagnosis present

## 2018-09-04 DIAGNOSIS — R109 Unspecified abdominal pain: Secondary | ICD-10-CM | POA: Diagnosis present

## 2018-09-04 DIAGNOSIS — Z803 Family history of malignant neoplasm of breast: Secondary | ICD-10-CM

## 2018-09-04 DIAGNOSIS — K219 Gastro-esophageal reflux disease without esophagitis: Secondary | ICD-10-CM | POA: Diagnosis present

## 2018-09-04 DIAGNOSIS — Z419 Encounter for procedure for purposes other than remedying health state, unspecified: Secondary | ICD-10-CM

## 2018-09-04 DIAGNOSIS — Z808 Family history of malignant neoplasm of other organs or systems: Secondary | ICD-10-CM

## 2018-09-04 DIAGNOSIS — Z7901 Long term (current) use of anticoagulants: Secondary | ICD-10-CM

## 2018-09-04 DIAGNOSIS — Z87442 Personal history of urinary calculi: Secondary | ICD-10-CM

## 2018-09-04 DIAGNOSIS — N951 Menopausal and female climacteric states: Secondary | ICD-10-CM | POA: Diagnosis present

## 2018-09-04 DIAGNOSIS — Z85820 Personal history of malignant melanoma of skin: Secondary | ICD-10-CM

## 2018-09-04 DIAGNOSIS — Z86011 Personal history of benign neoplasm of the brain: Secondary | ICD-10-CM

## 2018-09-04 DIAGNOSIS — K828 Other specified diseases of gallbladder: Secondary | ICD-10-CM

## 2018-09-04 DIAGNOSIS — Z90710 Acquired absence of both cervix and uterus: Secondary | ICD-10-CM

## 2018-09-04 DIAGNOSIS — Z6830 Body mass index (BMI) 30.0-30.9, adult: Secondary | ICD-10-CM

## 2018-09-04 DIAGNOSIS — F419 Anxiety disorder, unspecified: Secondary | ICD-10-CM | POA: Diagnosis present

## 2018-09-04 DIAGNOSIS — F329 Major depressive disorder, single episode, unspecified: Secondary | ICD-10-CM | POA: Diagnosis present

## 2018-09-04 DIAGNOSIS — M48061 Spinal stenosis, lumbar region without neurogenic claudication: Secondary | ICD-10-CM | POA: Diagnosis present

## 2018-09-04 DIAGNOSIS — I480 Paroxysmal atrial fibrillation: Secondary | ICD-10-CM | POA: Diagnosis present

## 2018-09-04 DIAGNOSIS — Z8261 Family history of arthritis: Secondary | ICD-10-CM

## 2018-09-04 DIAGNOSIS — I10 Essential (primary) hypertension: Secondary | ICD-10-CM | POA: Diagnosis present

## 2018-09-04 DIAGNOSIS — Z981 Arthrodesis status: Secondary | ICD-10-CM

## 2018-09-04 DIAGNOSIS — Z8249 Family history of ischemic heart disease and other diseases of the circulatory system: Secondary | ICD-10-CM

## 2018-09-04 HISTORY — PX: CHOLECYSTECTOMY: SHX55

## 2018-09-04 SURGERY — LAPAROSCOPIC CHOLECYSTECTOMY WITH INTRAOPERATIVE CHOLANGIOGRAM
Anesthesia: General

## 2018-09-04 MED ORDER — ONDANSETRON HCL 4 MG/2ML IJ SOLN
INTRAMUSCULAR | Status: DC | PRN
  Administered 2018-09-04: 4 mg via INTRAVENOUS

## 2018-09-04 MED ORDER — ROCURONIUM BROMIDE 10 MG/ML (PF) SYRINGE
PREFILLED_SYRINGE | INTRAVENOUS | Status: DC | PRN
  Administered 2018-09-04: 10 mg via INTRAVENOUS
  Administered 2018-09-04: 50 mg via INTRAVENOUS

## 2018-09-04 MED ORDER — CHLORHEXIDINE GLUCONATE CLOTH 2 % EX PADS: 6.0000 | MEDICATED_PAD | Freq: Once | CUTANEOUS | Status: DC

## 2018-09-04 MED ORDER — IOHEXOL 300 MG/ML  SOLN
INTRAMUSCULAR | Status: DC | PRN
  Administered 2018-09-04: 6 mL

## 2018-09-04 MED ORDER — HYDRALAZINE HCL 20 MG/ML IJ SOLN
5.0000 mg | Freq: Once | INTRAMUSCULAR | Status: AC
  Administered 2018-09-04: 5 mg via INTRAVENOUS

## 2018-09-04 MED ORDER — ACETAMINOPHEN 160 MG/5ML PO SOLN: 325.0000 mg | Freq: Once | ORAL | Status: AC | PRN

## 2018-09-04 MED ORDER — FENTANYL CITRATE (PF) 250 MCG/5ML IJ SOLN
INTRAMUSCULAR | Status: AC
  Filled 2018-09-04: qty 5

## 2018-09-04 MED ORDER — HYDROMORPHONE HCL 1 MG/ML IJ SOLN: 1.0000 mg | INTRAMUSCULAR | Status: DC | PRN

## 2018-09-04 MED ORDER — EPHEDRINE SULFATE-NACL 50-0.9 MG/10ML-% IV SOSY
PREFILLED_SYRINGE | INTRAVENOUS | Status: DC | PRN
  Administered 2018-09-04 (×2): 5 mg via INTRAVENOUS
  Administered 2018-09-04: 7.5 mg via INTRAVENOUS

## 2018-09-04 MED ORDER — LACTATED RINGERS IV SOLN: INTRAVENOUS | Status: DC

## 2018-09-04 MED ORDER — ACETAMINOPHEN 325 MG PO TABS
ORAL_TABLET | ORAL | Status: AC
  Filled 2018-09-04: qty 2

## 2018-09-04 MED ORDER — HYDROMORPHONE HCL 1 MG/ML IJ SOLN: 0.2500 mg | INTRAMUSCULAR | Status: DC | PRN

## 2018-09-04 MED ORDER — ONDANSETRON HCL 4 MG/2ML IJ SOLN: 4.0000 mg | Freq: Four times a day (QID) | INTRAMUSCULAR | Status: DC | PRN

## 2018-09-04 MED ORDER — ACETAMINOPHEN 10 MG/ML IV SOLN: 1000.0000 mg | Freq: Once | INTRAVENOUS | Status: DC | PRN

## 2018-09-04 MED ORDER — PROPOFOL 10 MG/ML IV BOLUS
INTRAVENOUS | Status: DC | PRN
  Administered 2018-09-04: 20 mg via INTRAVENOUS
  Administered 2018-09-04: 30 mg via INTRAVENOUS
  Administered 2018-09-04: 130 mg via INTRAVENOUS
  Administered 2018-09-04: 20 mg via INTRAVENOUS

## 2018-09-04 MED ORDER — LISINOPRIL 20 MG PO TABS
20.0000 mg | ORAL_TABLET | Freq: Two times a day (BID) | ORAL | Status: DC
  Administered 2018-09-04 – 2018-09-06 (×4): 20 mg via ORAL
  Filled 2018-09-04 (×4): qty 1

## 2018-09-04 MED ORDER — ALPRAZOLAM 0.5 MG PO TABS
0.5000 mg | ORAL_TABLET | Freq: Two times a day (BID) | ORAL | Status: DC | PRN
  Administered 2018-09-04 – 2018-09-05 (×2): 0.5 mg via ORAL
  Filled 2018-09-04 (×2): qty 1

## 2018-09-04 MED ORDER — BUPIVACAINE-EPINEPHRINE (PF) 0.25% -1:200000 IJ SOLN
INTRAMUSCULAR | Status: AC
  Filled 2018-09-04: qty 30

## 2018-09-04 MED ORDER — DIPHENHYDRAMINE HCL 50 MG/ML IJ SOLN
INTRAMUSCULAR | Status: AC
  Filled 2018-09-04: qty 1

## 2018-09-04 MED ORDER — ESCITALOPRAM OXALATE 20 MG PO TABS: 20.0000 mg | ORAL_TABLET | Freq: Every evening | ORAL | Status: DC

## 2018-09-04 MED ORDER — PROMETHAZINE HCL 25 MG/ML IJ SOLN: 6.2500 mg | INTRAMUSCULAR | Status: DC | PRN

## 2018-09-04 MED ORDER — ROCURONIUM BROMIDE 10 MG/ML (PF) SYRINGE
PREFILLED_SYRINGE | INTRAVENOUS | Status: AC
  Filled 2018-09-04: qty 10

## 2018-09-04 MED ORDER — HYDRALAZINE HCL 20 MG/ML IJ SOLN: 5.0000 mg | Freq: Once | INTRAMUSCULAR | Status: DC

## 2018-09-04 MED ORDER — HYDRALAZINE HCL 20 MG/ML IJ SOLN
INTRAMUSCULAR | Status: AC
  Administered 2018-09-04: 5 mg via INTRAVENOUS
  Filled 2018-09-04: qty 1

## 2018-09-04 MED ORDER — SODIUM CHLORIDE 0.9 % IV BOLUS
500.0000 mL | Freq: Once | INTRAVENOUS | Status: AC
  Administered 2018-09-05: 500 mL via INTRAVENOUS

## 2018-09-04 MED ORDER — DEXAMETHASONE SODIUM PHOSPHATE 10 MG/ML IJ SOLN
INTRAMUSCULAR | Status: DC | PRN
  Administered 2018-09-04: 5 mg via INTRAVENOUS

## 2018-09-04 MED ORDER — FENTANYL CITRATE (PF) 250 MCG/5ML IJ SOLN
INTRAMUSCULAR | Status: DC | PRN
  Administered 2018-09-04: 50 ug via INTRAVENOUS
  Administered 2018-09-04: 25 ug via INTRAVENOUS
  Administered 2018-09-04: 50 ug via INTRAVENOUS
  Administered 2018-09-04: 25 ug via INTRAVENOUS
  Administered 2018-09-04: 50 ug via INTRAVENOUS

## 2018-09-04 MED ORDER — CEFAZOLIN SODIUM-DEXTROSE 2-4 GM/100ML-% IV SOLN
2.0000 g | INTRAVENOUS | Status: AC
  Administered 2018-09-04: 2 g via INTRAVENOUS
  Filled 2018-09-04: qty 100

## 2018-09-04 MED ORDER — HYDROCODONE-ACETAMINOPHEN 5-325 MG PO TABS: 1.0000 | ORAL_TABLET | ORAL | Status: DC | PRN

## 2018-09-04 MED ORDER — BUPIVACAINE-EPINEPHRINE (PF) 0.5% -1:200000 IJ SOLN
INTRAMUSCULAR | Status: AC
  Filled 2018-09-04: qty 30

## 2018-09-04 MED ORDER — PROPOFOL 10 MG/ML IV BOLUS
INTRAVENOUS | Status: AC
  Filled 2018-09-04: qty 20

## 2018-09-04 MED ORDER — ACETAMINOPHEN 325 MG PO TABS
325.0000 mg | ORAL_TABLET | Freq: Once | ORAL | Status: AC | PRN
  Administered 2018-09-04: 650 mg via ORAL

## 2018-09-04 MED ORDER — BUSPIRONE HCL 5 MG PO TABS
15.0000 mg | ORAL_TABLET | Freq: Two times a day (BID) | ORAL | Status: DC
  Administered 2018-09-04 – 2018-09-06 (×4): 15 mg via ORAL
  Filled 2018-09-04 (×4): qty 3

## 2018-09-04 MED ORDER — GLYCOPYRROLATE PF 0.2 MG/ML IJ SOSY
PREFILLED_SYRINGE | INTRAMUSCULAR | Status: AC
  Filled 2018-09-04: qty 1

## 2018-09-04 MED ORDER — TRAMADOL HCL 50 MG PO TABS: 50.0000 mg | ORAL_TABLET | Freq: Four times a day (QID) | ORAL | Status: DC | PRN

## 2018-09-04 MED ORDER — GLYCOPYRROLATE 0.2 MG/ML IJ SOLN
INTRAMUSCULAR | Status: DC | PRN
  Administered 2018-09-04 (×2): 0.1 mg via INTRAVENOUS

## 2018-09-04 MED ORDER — ACETAMINOPHEN 325 MG PO TABS
650.0000 mg | ORAL_TABLET | Freq: Four times a day (QID) | ORAL | Status: DC | PRN
  Administered 2018-09-05: 650 mg via ORAL
  Filled 2018-09-04: qty 2

## 2018-09-04 MED ORDER — POTASSIUM CHLORIDE IN NACL 20-0.45 MEQ/L-% IV SOLN
INTRAVENOUS | Status: DC
  Administered 2018-09-04: 20:00:00 via INTRAVENOUS
  Filled 2018-09-04 (×2): qty 1000

## 2018-09-04 MED ORDER — SUGAMMADEX SODIUM 200 MG/2ML IV SOLN
INTRAVENOUS | Status: DC | PRN
  Administered 2018-09-04: 200 mg via INTRAVENOUS

## 2018-09-04 MED ORDER — ACETAMINOPHEN 650 MG RE SUPP: 650.0000 mg | Freq: Four times a day (QID) | RECTAL | Status: DC | PRN

## 2018-09-04 MED ORDER — LACTATED RINGERS IV SOLN
INTRAVENOUS | Status: DC
  Administered 2018-09-04 (×2): via INTRAVENOUS

## 2018-09-04 MED ORDER — BUPIVACAINE-EPINEPHRINE 0.5% -1:200000 IJ SOLN
INTRAMUSCULAR | Status: DC | PRN
  Administered 2018-09-04: 20 mL

## 2018-09-04 MED ORDER — MEPERIDINE HCL 50 MG/ML IJ SOLN: 6.2500 mg | INTRAMUSCULAR | Status: DC | PRN

## 2018-09-04 MED ORDER — LACTATED RINGERS IR SOLN
Status: DC | PRN
  Administered 2018-09-04: 1000 mL

## 2018-09-04 MED ORDER — LIDOCAINE 2% (20 MG/ML) 5 ML SYRINGE
INTRAMUSCULAR | Status: AC
  Filled 2018-09-04: qty 5

## 2018-09-04 MED ORDER — CARVEDILOL 12.5 MG PO TABS
12.5000 mg | ORAL_TABLET | Freq: Two times a day (BID) | ORAL | Status: DC
  Administered 2018-09-04 – 2018-09-05 (×2): 12.5 mg via ORAL
  Filled 2018-09-04 (×2): qty 1

## 2018-09-04 MED ORDER — ONDANSETRON 4 MG PO TBDP: 4.0000 mg | ORAL_TABLET | Freq: Four times a day (QID) | ORAL | Status: DC | PRN

## 2018-09-04 MED ORDER — DIPHENHYDRAMINE HCL 50 MG/ML IJ SOLN
INTRAMUSCULAR | Status: DC | PRN
  Administered 2018-09-04: 12.5 mg via INTRAVENOUS

## 2018-09-04 MED ORDER — LIDOCAINE HCL (CARDIAC) PF 100 MG/5ML IV SOSY
PREFILLED_SYRINGE | INTRAVENOUS | Status: DC | PRN
  Administered 2018-09-04: 80 mg via INTRAVENOUS

## 2018-09-04 SURGICAL SUPPLY — 32 items
APPLIER CLIP ROT 10 11.4 M/L (STAPLE) ×3
CABLE HIGH FREQUENCY MONO STRZ (ELECTRODE) ×3 IMPLANT
CHLORAPREP W/TINT 26 (MISCELLANEOUS) ×3 IMPLANT
CLIP APPLIE ROT 10 11.4 M/L (STAPLE) ×1 IMPLANT
CLOSURE WOUND 1/2 X4 (GAUZE/BANDAGES/DRESSINGS) ×1
COVER MAYO STAND STRL (DRAPES) ×3 IMPLANT
COVER SURGICAL LIGHT HANDLE (MISCELLANEOUS) ×3 IMPLANT
COVER WAND RF STERILE (DRAPES) IMPLANT
DECANTER SPIKE VIAL GLASS SM (MISCELLANEOUS) ×3 IMPLANT
DRAPE C-ARM 42X120 X-RAY (DRAPES) ×3 IMPLANT
ELECT REM PT RETURN 15FT ADLT (MISCELLANEOUS) ×3 IMPLANT
GAUZE SPONGE 2X2 8PLY STRL LF (GAUZE/BANDAGES/DRESSINGS) ×1 IMPLANT
GLOVE SURG ORTHO 8.0 STRL STRW (GLOVE) ×15 IMPLANT
GOWN STRL REUS W/TWL XL LVL3 (GOWN DISPOSABLE) ×9 IMPLANT
HEMOSTAT SURGICEL 4X8 (HEMOSTASIS) IMPLANT
KIT BASIN OR (CUSTOM PROCEDURE TRAY) ×3 IMPLANT
KIT TURNOVER KIT A (KITS) ×3 IMPLANT
POUCH SPECIMEN RETRIEVAL 10MM (ENDOMECHANICALS) ×3 IMPLANT
SCISSORS LAP 5X35 DISP (ENDOMECHANICALS) ×3 IMPLANT
SET CHOLANGIOGRAPH MIX (MISCELLANEOUS) ×3 IMPLANT
SET IRRIG TUBING LAPAROSCOPIC (IRRIGATION / IRRIGATOR) ×3 IMPLANT
SET TUBE SMOKE EVAC HIGH FLOW (TUBING) ×3 IMPLANT
SLEEVE XCEL OPT CAN 5 100 (ENDOMECHANICALS) ×3 IMPLANT
SPONGE GAUZE 2X2 STER 10/PKG (GAUZE/BANDAGES/DRESSINGS) ×2
STRIP CLOSURE SKIN 1/2X4 (GAUZE/BANDAGES/DRESSINGS) ×2 IMPLANT
SUT MNCRL AB 4-0 PS2 18 (SUTURE) ×3 IMPLANT
TOWEL OR 17X26 10 PK STRL BLUE (TOWEL DISPOSABLE) ×3 IMPLANT
TOWEL OR NON WOVEN STRL DISP B (DISPOSABLE) ×3 IMPLANT
TRAY LAPAROSCOPIC (CUSTOM PROCEDURE TRAY) ×3 IMPLANT
TROCAR BLADELESS OPT 5 100 (ENDOMECHANICALS) ×3 IMPLANT
TROCAR XCEL BLUNT TIP 100MML (ENDOMECHANICALS) ×3 IMPLANT
TROCAR XCEL NON-BLD 11X100MML (ENDOMECHANICALS) ×3 IMPLANT

## 2018-09-04 NOTE — Interval H&P Note (Signed)
History and Physical Interval Note:  09/04/2018 3:37 PM  Rebecca Houston  has presented today for surgery, with the diagnosis of BILIARY DYSKINESIA, ABDOMNIAL PAIN EPIGASTRIC.  The various methods of treatment have been discussed with the patient and family. After consideration of risks, benefits and other options for treatment, the patient has consented to    Procedure(s): LAPAROSCOPIC CHOLECYSTECTOMY WITH INTRAOPERATIVE CHOLANGIOGRAM (N/A) as a surgical intervention.    The patient's history has been reviewed, patient examined, no change in status, stable for surgery.  I have reviewed the patient's chart and labs.  Questions were answered to the patient's satisfaction.    Armandina Gemma, [facility]: [place]

## 2018-09-04 NOTE — Progress Notes (Deleted)
Pts HR running in 120's-130's at rest. Pt denies any chest pain or tightness, no SOB. Pt has a hx of a-fib, but pt reports she does not feel like she is in a-fib right now. Scheduled BP meds given this evening. Notified [facility] answering service. Received a call back from Dr. Lucia Gaskins. New orders for 500 cc NS bolus x1 & to draw a H&H. If Hgb <10 then please call physician & let him know. Pt currently resting comfortably in bed, rest of VS wnl. Will continue to monitor.

## 2018-09-04 NOTE — Transfer of Care (Signed)
Immediate Anesthesia Transfer of Care Note  Patient: Rebecca Houston  Procedure(s) Performed: LAPAROSCOPIC CHOLECYSTECTOMY WITH INTRAOPERATIVE CHOLANGIOGRAM (N/A )  Patient Location: PACU  Anesthesia Type:General  Level of Consciousness: sedated, patient cooperative and responds to stimulation  Airway & Oxygen Therapy: Patient Spontanous Breathing and Patient connected to face mask oxygen  Post-op Assessment: Report given to RN and Post -op Vital signs reviewed and stable  Post vital signs: Reviewed and stable  Last Vitals:  Vitals Value Taken Time  BP 179/89 09/04/18 1715  Temp    Pulse 66 09/04/18 1716  Resp 13 09/04/18 1716  SpO2 93 % 09/04/18 1716  Vitals shown include unvalidated device data.  Last Pain:  Vitals:   09/04/18 1223  TempSrc:   PainSc: 0-No pain         Complications: No apparent anesthesia complications

## 2018-09-04 NOTE — Progress Notes (Signed)
Pts HR running in 120's-130's at rest. Pt denies any chest pain or tightness, no SOB. Pt has a hx of a-fib, but pt reports she does not feel like she is in a-fib right now. Scheduled BP meds given this evening. Notified [facility] answering service. Received a call back from Dr. Lucia Gaskins. New orders for 500 cc NS bolus x1 & to draw a H&H. If Hgb <10 then please call physician & let him know. Pt currently resting comfortably in bed, rest of VS wnl. Will continue to monitor.

## 2018-09-04 NOTE — Op Note (Signed)
Procedure Note  Pre-operative Diagnosis:  Biliary dyskinesia, abdominal pain  Post-operative Diagnosis:  same  Surgeon:  Armandina Gemma, MD  Assistant:  none   Procedure:  Laparoscopic cholecystectomy with intra-operative cholangiography  Anesthesia:  General  Estimated Blood Loss:  minimal  Drains: none         Specimen: gallbladder to pathology  Indications:  Patient is referred by Etter Sjogren, FNP-C, for surgical evaluation and management of biliary dyskinesia and abdominal pain. Patient has a long-standing history of intermittent epigastric abdominal pain radiating into the chest and into the back associated with moderate belching lasting for approximately 1 hour. Patient has undergone ultrasound examination on more than one occasion and has never had gallstones demonstrated. Her most recent episode lasted approximately 1 hour. Episodes are relieved by drinking milk. Patient was referred for nuclear medicine hepatobiliary scan which was performed in [place], [place], on March 24, 2018. This demonstrated a very low gallbladder ejection fraction of 10%, consistent with biliary dyskinesia. Patient is referred at this time for consideration for cholecystectomy. Patient has no prior history of hepatobiliary or pancreatic disease. She denies jaundice or acholic stools. Her only abdominal procedure was a preperitoneal approach to the lumbar spine for back surgery. There is a family history of gallbladder disease and the patient's sister. Patient presents today to discuss cholecystectomy.  Procedure Details:  The patient was seen in the pre-op holding area. The risks, benefits, complications, treatment options, and expected outcomes were previously discussed with the patient. The patient agreed with the proposed plan and has signed the informed consent form.  The patient was transported to operating room # 5 at the [facility]. The patient was placed in the supine  position on the operating room table. Following induction of general anesthesia, the abdomen was prepped and draped in the usual aseptic fashion.  An incision was made in the skin near the umbilicus. The midline fascia was incised and the peritoneal cavity was entered and a Hasson cannula was introduced under direct vision. The cannula was secured with a 0-Vicryl pursestring suture. Pneumoperitoneum was established with carbon dioxide. Additional cannulae were introduced under direct vision along the right costal margin in the midline, mid-clavicular line, and anterior axillary line.   The gallbladder was identified and the fundus grasped and retracted cephalad. Adhesions were taken down bluntly and the electrocautery was utilized as needed, taking care not to involve any adjacent structures. The infundibulum was grasped and retracted laterally, exposing the peritoneum overlying the triangle of Calot. The peritoneum was incised and structures exposed with blunt dissection. The cystic duct was clearly identified, bluntly dissected circumferentially, and clipped at the neck of the gallbladder.  An incision was made in the cystic duct and the cholangiogram catheter introduced. The catheter was secured using an ligaclip.  Real-time cholangiography was performed using C-arm fluoroscopy.  There was rapid filling of a normal caliber common bile duct.  There was reflux of contrast into the left and right hepatic ductal systems.  There was an anatomic variant where the cystic duct inserts into the right hepatic duct.  There was free flow distally into the duodenum without filling defect or obstruction.  The catheter was removed from the peritoneal cavity.  The cystic duct was then ligated with ligaclips and divided. The cystic artery was identified, dissected circumferentially, ligated with ligaclips, and divided.  The gallbladder was dissected away from the gallbladder bed using the electrocautery for hemostasis.  The gallbladder was completely removed from the liver and placed  into an endocatch bag. The gallbladder was removed in the endocatch bag through the umbilical port site and submitted to pathology for review.  The right upper quadrant was irrigated and the gallbladder bed was inspected. Hemostasis was achieved with the electrocautery.  Cannulae were removed under direct vision and good hemostasis was noted. Pneumoperitoneum was released and the majority of the carbon dioxide evacuated. The umbilical wound was irrigated and the fascia was then closed with the pursestring suture.  Local anesthetic was infiltrated at all port sites. Skin incisions were closed with 4-0 Monocril subcuticular sutures and Dermabond was applied.  Instrument, sponge, and needle counts were correct at the conclusion of the case.  The patient was awakened from anesthesia and brought to the recovery room in stable condition.  The patient tolerated the procedure well.   Armandina Gemma, MD [facility] Office: [phone]

## 2018-09-04 NOTE — Anesthesia Postprocedure Evaluation (Signed)
Anesthesia Post Note  Patient: Rebecca Houston  Procedure(s) Performed: LAPAROSCOPIC CHOLECYSTECTOMY WITH INTRAOPERATIVE CHOLANGIOGRAM (N/A )     Patient location during evaluation: PACU Anesthesia Type: General Level of consciousness: awake and alert Pain management: pain level controlled Vital Signs Assessment: post-procedure vital signs reviewed and stable Respiratory status: spontaneous breathing, nonlabored ventilation, respiratory function stable and patient connected to nasal cannula oxygen Cardiovascular status: blood pressure returned to baseline and stable Postop Assessment: no apparent nausea or vomiting Anesthetic complications: no    Last Vitals:  Vitals:   09/04/18 1815 09/04/18 1830  BP: (!) 146/73 136/67  Pulse: 66 64  Resp: 13 14  Temp:  36.8 C  SpO2: 96% 97%    Last Pain:  Vitals:   09/04/18 1830  TempSrc:   PainSc: 0-No pain                 Effie Berkshire

## 2018-09-04 NOTE — Anesthesia Procedure Notes (Signed)
Procedure Name: Intubation Date/Time: 09/04/2018 3:53 PM Performed by: Raenette Rover, CRNA Pre-anesthesia Checklist: Patient identified, Emergency Drugs available, Suction available and Patient being monitored Patient Re-evaluated:Patient Re-evaluated prior to induction Oxygen Delivery Method: Circle system utilized Preoxygenation: Pre-oxygenation with 100% oxygen Induction Type: IV induction Ventilation: Mask ventilation without difficulty Laryngoscope Size: Mac and 3 Grade View: Grade II Tube type: Oral Tube size: 7.0 mm Number of attempts: 1 Airway Equipment and Method: Stylet Placement Confirmation: ETT inserted through vocal cords under direct vision,  positive ETCO2,  CO2 detector and breath sounds checked- equal and bilateral Secured at: 21 cm Tube secured with: Tape Dental Injury: Teeth and Oropharynx as per pre-operative assessment

## 2018-09-05 ENCOUNTER — Observation Stay (HOSPITAL_COMMUNITY): Payer: Medicare Other

## 2018-09-05 ENCOUNTER — Encounter (HOSPITAL_COMMUNITY): Payer: Self-pay | Admitting: Surgery

## 2018-09-05 DIAGNOSIS — Z8261 Family history of arthritis: Secondary | ICD-10-CM | POA: Diagnosis not present

## 2018-09-05 DIAGNOSIS — K828 Other specified diseases of gallbladder: Secondary | ICD-10-CM | POA: Diagnosis present

## 2018-09-05 DIAGNOSIS — Z6830 Body mass index (BMI) 30.0-30.9, adult: Secondary | ICD-10-CM | POA: Diagnosis not present

## 2018-09-05 DIAGNOSIS — Z808 Family history of malignant neoplasm of other organs or systems: Secondary | ICD-10-CM | POA: Diagnosis not present

## 2018-09-05 DIAGNOSIS — I10 Essential (primary) hypertension: Secondary | ICD-10-CM | POA: Diagnosis present

## 2018-09-05 DIAGNOSIS — Z87442 Personal history of urinary calculi: Secondary | ICD-10-CM | POA: Diagnosis not present

## 2018-09-05 DIAGNOSIS — Z90710 Acquired absence of both cervix and uterus: Secondary | ICD-10-CM | POA: Diagnosis not present

## 2018-09-05 DIAGNOSIS — Z981 Arthrodesis status: Secondary | ICD-10-CM | POA: Diagnosis not present

## 2018-09-05 DIAGNOSIS — K219 Gastro-esophageal reflux disease without esophagitis: Secondary | ICD-10-CM | POA: Diagnosis present

## 2018-09-05 DIAGNOSIS — N951 Menopausal and female climacteric states: Secondary | ICD-10-CM | POA: Diagnosis present

## 2018-09-05 DIAGNOSIS — F329 Major depressive disorder, single episode, unspecified: Secondary | ICD-10-CM | POA: Diagnosis present

## 2018-09-05 DIAGNOSIS — I480 Paroxysmal atrial fibrillation: Secondary | ICD-10-CM | POA: Diagnosis present

## 2018-09-05 DIAGNOSIS — Z8249 Family history of ischemic heart disease and other diseases of the circulatory system: Secondary | ICD-10-CM | POA: Diagnosis not present

## 2018-09-05 DIAGNOSIS — F419 Anxiety disorder, unspecified: Secondary | ICD-10-CM | POA: Diagnosis present

## 2018-09-05 DIAGNOSIS — Z86011 Personal history of benign neoplasm of the brain: Secondary | ICD-10-CM | POA: Diagnosis not present

## 2018-09-05 DIAGNOSIS — Z85820 Personal history of malignant melanoma of skin: Secondary | ICD-10-CM | POA: Diagnosis not present

## 2018-09-05 DIAGNOSIS — M48061 Spinal stenosis, lumbar region without neurogenic claudication: Secondary | ICD-10-CM | POA: Diagnosis present

## 2018-09-05 DIAGNOSIS — Z803 Family history of malignant neoplasm of breast: Secondary | ICD-10-CM | POA: Diagnosis not present

## 2018-09-05 DIAGNOSIS — E669 Obesity, unspecified: Secondary | ICD-10-CM

## 2018-09-05 DIAGNOSIS — Z7901 Long term (current) use of anticoagulants: Secondary | ICD-10-CM | POA: Diagnosis not present

## 2018-09-05 DIAGNOSIS — Z87891 Personal history of nicotine dependence: Secondary | ICD-10-CM | POA: Diagnosis not present

## 2018-09-05 DIAGNOSIS — Z79899 Other long term (current) drug therapy: Secondary | ICD-10-CM | POA: Diagnosis not present

## 2018-09-05 LAB — ECHOCARDIOGRAM COMPLETE
Height: 67 in
Weight: 3144.01 oz

## 2018-09-05 LAB — HEMOGLOBIN AND HEMATOCRIT, BLOOD
HCT: 37.2 % (ref 36.0–46.0)
Hemoglobin: 12.3 g/dL (ref 12.0–15.0)

## 2018-09-05 MED ORDER — RIVAROXABAN 10 MG PO TABS
20.0000 mg | ORAL_TABLET | Freq: Every day | ORAL | Status: DC
  Administered 2018-09-05: 20 mg via ORAL
  Filled 2018-09-05: qty 2

## 2018-09-05 MED ORDER — METOPROLOL TARTRATE 50 MG PO TABS
50.0000 mg | ORAL_TABLET | Freq: Two times a day (BID) | ORAL | Status: DC
  Administered 2018-09-05 – 2018-09-06 (×2): 50 mg via ORAL
  Filled 2018-09-05 (×3): qty 1

## 2018-09-05 MED ORDER — ALUM & MAG HYDROXIDE-SIMETH 200-200-20 MG/5ML PO SUSP
30.0000 mL | ORAL | Status: DC | PRN
  Administered 2018-09-05 (×2): 30 mL via ORAL
  Filled 2018-09-05 (×2): qty 30

## 2018-09-05 NOTE — Care Management Obs Status (Signed)
[place] NOTIFICATION   Patient Details  Name: Detrice Cales MRN: [id number] Date of Birth: May 22, 1945   Medicare Observation Status Notification Given:       Leeroy Cha, RN 09/05/2018, 9:14 AM

## 2018-09-05 NOTE — Progress Notes (Signed)
Dr Harlow Asa notified of EKG results.

## 2018-09-05 NOTE — Progress Notes (Signed)
  Echocardiogram 2D Echocardiogram has been performed.  Rebecca Houston 09/05/2018, 3:01 PM

## 2018-09-05 NOTE — Progress Notes (Signed)
Assessment & Plan: POD#1 - status post lap chole with IOC  Comfortable post-op, no complaints  Advance to regular diet - NOT diabetic  OOB, ambulate Atrial fibrillation  Intermittent problem in past - no current cardiologist  Discussed with daughter by phone this AM  Will request consult from [facility] - patient would like cardiologist here in [facility]       Office: [phone]   Chief Complaint: Biliary dyskinesia  Subjective: Patient in bed, daughter on speaker phone.  No complaints, denies pain.  Objective: Vital signs in last 24 hours: Temp:  [97.7 F (36.5 C)-98.3 F (36.8 C)] 98.1 F (36.7 C) (07/24 0458) Pulse Rate:  [58-124] 124 (07/24 0458) Resp:  [13-18] 16 (07/24 0458) BP: (119-189)/(65-97) 119/95 (07/24 0458) SpO2:  [92 %-100 %] 96 % (07/24 0458) Weight:  [89.1 kg] 89.1 kg (07/23 1200)    Intake/Output from previous day: 07/23 0701 - 07/24 0700 In: 2745.4 [P.O.:630; I.V.:1515.4; IV Piggyback:600] Out: 1430 [Urine:1400; Blood:30] Intake/Output this shift: No intake/output data recorded.  Physical Exam: HEENT - sclerae clear, mucous membranes moist Neck - soft Chest - clear bilaterally Cor - 130's rate Abdomen - soft, wounds dry and intact Ext - no edema, non-tender Neuro - alert & oriented, no focal deficits  Lab Results:  Recent Labs    09/03/18 0835 09/04/18 2353  WBC 8.4  --   HGB 12.4 12.3  HCT 38.3 37.2  PLT 193  --    BMET Recent Labs    09/03/18 0835  NA 140  K 4.0  CL 103  CO2 28  GLUCOSE 130*  BUN 22  CREATININE 0.95  CALCIUM 8.9   PT/INR No results for input(s): LABPROT, INR in the last 72 hours. Comprehensive Metabolic Panel:    Component Value Date/Time   NA 140 09/03/2018 0835   NA 138 04/11/2017 0903   K 4.0 09/03/2018 0835   K 3.6 04/11/2017 0903   CL 103 09/03/2018 0835   CL 102 04/11/2017 0903   CO2 28 09/03/2018 0835   CO2 25 04/11/2017  0903   BUN 22 09/03/2018 0835   BUN 21 (H) 04/11/2017 0903   CREATININE 0.95 09/03/2018 0835   CREATININE 0.95 04/11/2017 0903   GLUCOSE 130 (H) 09/03/2018 0835   GLUCOSE 170 (H) 04/11/2017 0903   CALCIUM 8.9 09/03/2018 0835   CALCIUM 8.8 (L) 04/11/2017 0903    Studies/Results: Dg Cholangiogram Operative  Result Date: 09/04/2018 CLINICAL DATA:  Intraoperative cholangiogram during laparoscopic cholecystectomy. EXAM: INTRAOPERATIVE CHOLANGIOGRAM FLUOROSCOPY TIME:  14 seconds COMPARISON:  None FINDINGS: Intraoperative cholangiographic images of the right upper abdominal quadrant during laparoscopic cholecystectomy are provided for review. Surgical clips overlie the expected location of the gallbladder fossa. Contrast injection demonstrates selective cannulation of the central aspect of the cystic duct. There is passage of contrast through the central aspect of the cystic duct with filling of a non dilated common bile duct. There is passage of contrast though the CBD and into the descending portion of the duodenum. There is minimal reflux of injected contrast into the common hepatic duct and central aspect of the non dilated intrahepatic biliary system. There may be an anomalous insertion of a posterior division of the right intrahepatic biliary tree either into the central aspect of the cystic duct or immediately adjacent to the cystic duct. There is minimal opacification of the central aspect  of the pancreatic duct appears nondilated. There are no discrete filling defects within the opacified portions of the biliary system to suggest the presence of choledocholithiasis. IMPRESSION: 1. No evidence of choledocholithiasis. 2. 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 Electronically Signed   By: Sandi Mariscal M.D.   On: 09/04/2018 17:59      Armandina Gemma  09/05/2018  Patient ID: Rebecca Houston, female   DOB: 05-17-1945, 73 y.o.   MRN: [id number]

## 2018-09-05 NOTE — Plan of Care (Signed)

## 2018-09-05 NOTE — Consult Note (Signed)
CARDIOLOGY CONSULT NOTE  Patient ID: Genelda Roark MRN: [id number] DOB/AGE: 06/19/1945 73 y.o.  Admit date: 09/04/2018 Referring Physician  Armandina Gemma, MD Primary Physician:  Josem Kaufmann, MD Reason for Consultation  A. Fib  HPI:    Meggen Spaziani  is a 73 y.o. female  with hypertension, GERD, chronic back pain, admitted to the hospital for laparoscopic cholecystectomy, did well and last evening developed atrial fibrillation with rapid ventricular response.  Patient lives in [place], wants to establish with cardiology here.  Patient states that she was diagnosed with A. fib about 4 to 5 years ago when she was admitted to the hospital with palpitations and has been on Xarelto since then.  This has been on hold for the past 2 days for surgery.  She has had occasional breakthrough episodes that last a few minutes to a few hours but usually feels well immediately following the episodes of palpitations.  States that last night she has not felt any palpitations and she was surprised that she went into A. fib.  She is essentially asymptomatic today except for very minimal discomfort at the surgical site.  No nausea, no vomiting, no headache, no chest pain or dyspnea.  She is fairly active.  She has undergone back surgery last year without any periprocedural cardiac complications.  Past Medical History:  Diagnosis Date  . A-fib ([place])   . Anxiety   . Arthritis   . Basal cell carcinoma   . Brain tumor (benign) ([place])   . Bruises easily   . Cancer ([place])    melanoma on neck  . Depression   . Heartburn   . History of kidney stones   . Hypertension   . ILD (interstitial lung disease) ([place]) 01/2018   Early  . Lumbar foraminal stenosis    L5-S1  . Melanoma ([place])   . PONV (postoperative nausea and vomiting)   . Psoriasis   . Pulmonary nodules    Stable bilateral sub-6 mm pulmonary nodules   . Renal cyst     Past Surgical History:  Procedure Laterality Date  . ABDOMINAL EXPOSURE  N/A 04/02/2016   Procedure: ABDOMINAL EXPOSURE;  Surgeon: Rosetta Posner, MD;  Location: [facility];  Service: Vascular;  Laterality: N/A;  . ABDOMINAL HYSTERECTOMY    . ANTERIOR CERVICAL DECOMP/DISCECTOMY FUSION N/A 04/16/2017   Procedure: Cervical Four-Five, Cervical Five-Six, Cervical Six-Seven  Anterior cervical decompression/discectomy, fusion;  Surgeon: Erline Levine, MD;  Location: [facility];  Service: Neurosurgery;  Laterality: N/A;  C4-5 C5-6 C6-7 Anterior cervical decompression/discectomy, fusion  . ANTERIOR LUMBAR FUSION N/A 04/02/2016   Procedure: Lumbar five-Sacral one  Anterior lumbar interbody fusion with Dr. Curt Jews;  Surgeon: Erline Levine, MD;  Location: [facility];  Service: Neurosurgery;  Laterality: N/A;  L5-S1 Anterior lumbar interbody fusion with Dr. Sherren Mocha Early  . BRAIN SURGERY    . BREAST REDUCTION SURGERY    . CATARACT EXTRACTION, BILATERAL    . CHOLECYSTECTOMY N/A 09/04/2018   Procedure: LAPAROSCOPIC CHOLECYSTECTOMY WITH INTRAOPERATIVE CHOLANGIOGRAM;  Surgeon: Armandina Gemma, MD;  Location: [facility];  Service: General;  Laterality: N/A;  . COLONOSCOPY    . NECK SURGERY     bone spur    Social History   Socioeconomic History  . Marital status: Married    Spouse name: Not on file  . Number of children: Not on file  . Years of education: Not on file  . Highest education level: Not on file  Occupational History  . Not on  file  Social Needs  . Financial resource strain: Not on file  . Food insecurity    Worry: Not on file    Inability: Not on file  . Transportation needs    Medical: Not on file    Non-medical: Not on file  Tobacco Use  . Smoking status: Former Research scientist (life sciences)  . Smokeless tobacco: Never Used  . Tobacco comment: quit smoking cigarettes in 2014  Substance and Sexual Activity  . Alcohol use: No  . Drug use: No  . Sexual activity: Not on file  Lifestyle  . Physical activity    Days per week: Not on file    Minutes per session: Not on file  . Stress: Not on file   Relationships  . Social Herbalist on phone: Not on file    Gets together: Not on file    Attends religious service: Not on file    Active member of club or organization: Not on file    Attends meetings of clubs or organizations: Not on file    Relationship status: Not on file  . Intimate partner violence    Fear of current or ex partner: Not on file    Emotionally abused: Not on file    Physically abused: Not on file    Forced sexual activity: Not on file  Other Topics Concern  . Not on file  Social History Narrative  . Not on file    No current facility-administered medications on file prior to encounter.    Current Outpatient Medications on File Prior to Encounter  Medication Sig Dispense Refill  . ALPRAZolam (XANAX) 0.5 MG tablet Take 0.5 mg by mouth 2 (two) times daily as needed for anxiety.    . busPIRone (BUSPAR) 15 MG tablet Take 15 mg by mouth 2 (two) times daily.    . carvedilol (COREG) 12.5 MG tablet Take 12.5 mg by mouth 2 (two) times daily with a meal.    . escitalopram (LEXAPRO) 20 MG tablet Take 20 mg by mouth every evening.    Marland Kitchen lisinopril (PRINIVIL,ZESTRIL) 20 MG tablet Take 20 mg by mouth 2 (two) times daily.    Marland Kitchen omeprazole (PRILOSEC) 20 MG capsule Take 20 mg by mouth every other day.    Marland Kitchen Propylene Glycol (SYSTANE BALANCE) 0.6 % SOLN Place 1 drop into both eyes as needed (dry eyes).    . rivaroxaban (XARELTO) 20 MG TABS tablet Take 20 mg by mouth daily with supper.      ROS  .Review of Systems  Constitution: Negative for chills, decreased appetite, malaise/fatigue and weight gain.  Cardiovascular: Negative for dyspnea on exertion, leg swelling and syncope.  Endocrine: Negative for cold intolerance.  Hematologic/Lymphatic: Does not bruise/bleed easily.  Musculoskeletal: Positive for back pain and joint pain. Negative for joint swelling.  Gastrointestinal: Positive for abdominal pain. Negative for anorexia, change in bowel habit, hematochezia and  melena.  Neurological: Negative for headaches and light-headedness.  Psychiatric/Behavioral: Negative for depression and substance abuse. The patient is nervous/anxious.   All other systems reviewed and are negative.  Objective  Blood pressure (!) 119/95, pulse 95, temperature 98.1 F (36.7 C), temperature source Oral, resp. rate 16, height 5\' 7"  (1.702 m), weight 89.1 kg, SpO2 96 %. Body mass index is 30.78 kg/m.  Physical Exam  Constitutional: She appears well-developed. No distress.  Mildly obese  HENT:  Head: Atraumatic.  Eyes: Conjunctivae are normal.  Neck: Neck supple. No JVD present. No thyromegaly present.  Cardiovascular:  Intact distal pulses and normal pulses. An irregularly irregular rhythm present. Exam reveals no gallop, no S3 and no S4.  No murmur heard. S1 is variable, S2 is normal.   Pulmonary/Chest: Effort normal and breath sounds normal.  Abdominal: Soft. Bowel sounds are normal.  Musculoskeletal: Normal range of motion.        General: No edema.  Neurological: She is alert.  Skin: Skin is warm and dry.  Psychiatric: She has a normal mood and affect.   Radiology   Dg Cholangiogram Operative  Result Date: 09/04/2018 CLINICAL DATA:  Intraoperative cholangiogram during laparoscopic cholecystectomy. EXAM: INTRAOPERATIVE CHOLANGIOGRAM FLUOROSCOPY TIME:  14 seconds COMPARISON:  None FINDINGS: Intraoperative cholangiographic images of the right upper abdominal quadrant during laparoscopic cholecystectomy are provided for review. Surgical clips overlie the expected location of the gallbladder fossa. Contrast injection demonstrates selective cannulation of the central aspect of the cystic duct. There is passage of contrast through the central aspect of the cystic duct with filling of a non dilated common bile duct. There is passage of contrast though the CBD and into the descending portion of the duodenum. There is minimal reflux of injected contrast into the common hepatic  duct and central aspect of the non dilated intrahepatic biliary system. There may be an anomalous insertion of a posterior division of the right intrahepatic biliary tree either into the central aspect of the cystic duct or immediately adjacent to the cystic duct. There is minimal opacification of the central aspect of the pancreatic duct appears nondilated. There are no discrete filling defects within the opacified portions of the biliary system to suggest the presence of choledocholithiasis. IMPRESSION: 1. No evidence of choledocholithiasis. 2. 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 Electronically Signed   By: Sandi Mariscal M.D.   On: 09/04/2018 17:59    Laboratory Examination   CMP Latest Ref Rng & Units 09/03/2018 04/11/2017 03/27/2016  Glucose 70 - 99 mg/dL 130(H) 170(H) 110(H)  BUN 8 - 23 mg/dL 22 21(H) 19  Creatinine 0.44 - 1.00 mg/dL 0.95 0.95 0.87  Sodium 135 - 145 mmol/L 140 138 139  Potassium 3.5 - 5.1 mmol/L 4.0 3.6 3.9  Chloride 98 - 111 mmol/L 103 102 102  CO2 22 - 32 mmol/L 28 25 27   Calcium 8.9 - 10.3 mg/dL 8.9 8.8(L) 9.5   CBC Latest Ref Rng & Units 09/04/2018 09/03/2018 04/11/2017  WBC 4.0 - 10.5 K/uL - 8.4 7.3  Hemoglobin 12.0 - 15.0 g/dL 12.3 12.4 11.8(L)  Hematocrit 36.0 - 46.0 % 37.2 38.3 35.8(L)  Platelets 150 - 400 K/uL - 193 197   Lipid Panel  No results found for: CHOL, TRIG, HDL, CHOLHDL, VLDL, LDLCALC, LDLDIRECT HEMOGLOBIN A1C No results found for: HGBA1C, MPG TSH No results for input(s): TSH in the last 8760 hours. Cardiac Panel (last 3 results)  Medications:  Scheduled Meds: . busPIRone  15 mg Oral BID  . escitalopram  20 mg Oral QPM  . lisinopril  20 mg Oral BID  . metoprolol tartrate  50 mg Oral BID   Continuous Infusions: . 0.45 % NaCl with KCl 20 mEq / L 50 mL/hr at 09/05/18 0600   PRN  Meds:.acetaminophen **OR** acetaminophen, ALPRAZolam, alum & mag hydroxide-simeth, HYDROcodone-acetaminophen, HYDROmorphone (DILAUDID) injection, ondansetron **OR** ondansetron (ZOFRAN) IV, traMADol Medications Discontinued During This Encounter  Medication Reason  . HYDROcodone-acetaminophen (NORCO/VICODIN) 5-325 MG tablet  Patient Preference  . HYDROcodone-acetaminophen (NORCO/VICODIN) 5-325 MG tablet Patient Preference  . methocarbamol (ROBAXIN) 500 MG tablet Patient Preference  . lactated ringers infusion   . lactated ringers irrigation solution   . iohexol (OMNIPAQUE) 300 MG/ML solution   . bupivacaine-EPINEPHrine (MARCAINE W/ EPI) 0.5% -1:200000 (with pres) injection   . Chlorhexidine Gluconate Cloth 2 % PADS 6 each   . Chlorhexidine Gluconate Cloth 2 % PADS 6 each   . lactated ringers infusion Patient Transfer  . HYDROmorphone (DILAUDID) injection 0.25-0.5 mg Patient Transfer  . acetaminophen (OFIRMEV) IV 1,000 mg Patient Transfer  . meperidine (DEMEROL) injection 6.25-12.5 mg Patient Transfer  . promethazine (PHENERGAN) injection 6.25-12.5 mg Patient Transfer  . hydrALAZINE (APRESOLINE) injection 5 mg Patient Transfer  . carvedilol (COREG) tablet 12.5 mg    Current Meds  Medication Sig  . ALPRAZolam (XANAX) 0.5 MG tablet Take 0.5 mg by mouth 2 (two) times daily as needed for anxiety.  . busPIRone (BUSPAR) 15 MG tablet Take 15 mg by mouth 2 (two) times daily.  . carvedilol (COREG) 12.5 MG tablet Take 12.5 mg by mouth 2 (two) times daily with a meal.  . escitalopram (LEXAPRO) 20 MG tablet Take 20 mg by mouth every evening.  Marland Kitchen lisinopril (PRINIVIL,ZESTRIL) 20 MG tablet Take 20 mg by mouth 2 (two) times daily.  Marland Kitchen omeprazole (PRILOSEC) 20 MG capsule Take 20 mg by mouth every other day.  Marland Kitchen Propylene Glycol (SYSTANE BALANCE) 0.6 % SOLN Place 1 drop into both eyes as needed (dry eyes).  . rivaroxaban (XARELTO) 20 MG TABS tablet Take 20 mg by mouth daily with supper.    Cardiac studies    Echocardiogram 09/05/2018 :   1. The left ventricle has normal systolic function with an ejection fraction of 60-65%. The cavity size was normal. There is mild concentric left ventricular hypertrophy. Left ventricular diastolic Doppler parameters are indeterminate. Indeterminate due  to A. Fibrillation.  2. The right ventricle has normal systolic function. The cavity was normal. There is no increase in right ventricular wall thickness. Right ventricular systolic pressure is mildly elevated with an estimated pressure of 35.5 mmHg.  3. Trivial pericardial effusion is present.  Assessment  Paroxysmal atrial fibrillation with controlled ventricular response.  CHA2DS2-VASc Score is 3.  Yearly risk of stroke: 3.2%.  Score of 1=1.3; 2=2.2; 3=3.2; 4=4; 5=6.7; 6=9.8; 7=>9.8) -(CHF; HTN; vasc disease DM,  Female = 1; Age <65 =0; 65-74 = 1,  >75 =2; stroke = 2).    EKG 09/05/2018: Atrial fibrillation with controlled ventricular spots at the rate of 89 bpm, normal axis, no evidence of ischemia.  Normal QT interval. 2.  Primary essential hypertension 3.  Mild obesity Plan:   Patient essentially asymptomatic with regard to atrial fibrillation, rate is well controlled.  She did have mild tachycardia earlier this morning with heart rate around 110 to 120 bpm.  Will discontinue carvedilol and switch her to metoprolol 50 mg p.o. twice daily.  Her daughter is present over the telephone while I examined the patient and interviewed the patient.  As she remains asymptomatic, consideration is whether we should cardiovert versus rate control only.  As it is new onset, I prefer to do cardioversion if she is still in atrial fibrillation on her office visit.  As the anticoagulation is on hold, we prefer to wait for at least 3 weeks in the left away.  We could also consider an antiarrhythmic drug depending on her presentation.    As she is presently in  bed, she may not be symptomatic of it is yet to be seen whether she  develops any other symptoms of atrial fibrillation including fatigue or dyspnea.  Today she is not in any acute decompensated heart failure.  From cardiac standpoint she is stable, she could potentially be discharged in the morning on the present medications.  We will restart Xarelto 20 mg daily starting today. Will schedule for an echocardiogram (Reviewed).   To follow-up with me, I will set her up to see me back in 3 weeks in the office.  Adrian Prows, MD, [facility] 09/05/2018, 1:22 PM [facility]. [place] Pager: [phone] Office: [phone] If no answer Cell [phone]

## 2018-09-06 MED ORDER — METOPROLOL TARTRATE 50 MG PO TABS: 50.0000 mg | ORAL_TABLET | Freq: Two times a day (BID) | ORAL | 0 refills | Status: DC

## 2018-09-06 MED ORDER — HYDROCODONE-ACETAMINOPHEN 5-325 MG PO TABS: 1.0000 | ORAL_TABLET | ORAL | 0 refills | Status: DC | PRN

## 2018-09-06 NOTE — Progress Notes (Signed)
Pt stable at time of d/c. No needs at this time. Pt denies pain. Pt had no questions on d/c instructions given.

## 2018-09-06 NOTE — Discharge Instructions (Signed)
[facility] ______[facility] °LAPAROSCOPIC SURGERY: POST OP INSTRUCTIONS °Always review your discharge instruction sheet given to you by the facility where your surgery was performed. °IF YOU HAVE DISABILITY OR FAMILY LEAVE FORMS, YOU MUST BRING THEM TO THE OFFICE FOR PROCESSING.   °DO NOT GIVE THEM TO YOUR DOCTOR. ° °1. A prescription for pain medication may be given to you upon discharge.  Take your pain medication as prescribed, if needed.  If narcotic pain medicine is not needed, then you may take acetaminophen (Tylenol) or ibuprofen (Advil) as needed. °2. Take your usually prescribed medications unless otherwise directed. °3. If you need a refill on your pain medication, please contact your pharmacy.  They will contact our office to request authorization. Prescriptions will not be filled after 5pm or on week-ends. °4. You should follow a light diet the first few days after arrival home, such as soup and crackers, etc.  Be sure to include lots of fluids daily. °5. Most patients will experience some swelling and bruising in the area of the incisions.  Ice packs will help.  Swelling and bruising can take several days to resolve.  °6. It is common to experience some constipation if taking pain medication after surgery.  Increasing fluid intake and taking a stool softener (such as Colace) will usually help or prevent this problem from occurring.  A mild laxative (Milk of Magnesia or Miralax) should be taken according to package instructions if there are no bowel movements after 48 hours. °7. Unless discharge instructions indicate otherwise, you may remove your bandages 24-48 hours after surgery, and you may shower at that time.  You may have steri-strips (small skin tapes) in place directly over the incision.  These strips should be left on the skin for 7-10 days.  If your surgeon used skin glue on the incision, you may shower in 24 hours.  The glue will flake off over the next 2-3 weeks.  Any sutures or  staples will be removed at the office during your follow-up visit. °8. ACTIVITIES:  You may resume regular (light) daily activities beginning the next day--such as daily self-care, walking, climbing stairs--gradually increasing activities as tolerated.  You may have sexual intercourse when it is comfortable.  Refrain from any heavy lifting or straining until approved by your doctor. °a. You may drive when you are no longer taking prescription pain medication, you can comfortably wear a seatbelt, and you can safely maneuver your car and apply brakes. °b. RETURN TO WORK:  __________________________________________________________ °9. You should see your doctor in the office for a follow-up appointment approximately 2-3 weeks after your surgery.  Make sure that you call for this appointment within a day or two after you arrive home to insure a convenient appointment time. °10. OTHER INSTRUCTIONS: __________________________________________________________________________________________________________________________ __________________________________________________________________________________________________________________________ °WHEN TO CALL YOUR DOCTOR: °1. Fever over 101.0 °2. Inability to urinate °3. Continued bleeding from incision. °4. Increased pain, redness, or drainage from the incision. °5. Increasing abdominal pain ° °The clinic staff is available to answer your questions during regular business hours.  Please don’t hesitate to call and ask to speak to one of the nurses for clinical concerns.  If you have a medical emergency, go to the nearest emergency room or call 911.  A surgeon from [facility] is always on call at the hospital. °[address], [address], [place], [place]  [address] ? [address] [address], [place], [place]   [address] °[phone] ? [phone] ? FAX [phone] °Web site:   [email or web address] °

## 2018-09-06 NOTE — Plan of Care (Signed)
Pt to d/c home when nursing staff is ready for discharge. No needs this am. Pt stable and ambulating in room. She is hydrating and passing gas well. No needs or pain to report at this time.

## 2018-09-06 NOTE — Discharge Summary (Signed)
Physician Discharge Summary  Patient ID: Rebecca Houston MRN: [id number] DOB/AGE: [birth year] 73 y.o.  Admit date: 09/04/2018 Discharge date: 09/06/2018  Admission Diagnoses: Biliary dyskinesia Atrial fibrillation Lumbar stenosis Depression  Discharge Diagnoses:  Principal Problem:   Biliary dyskinesia Atrial fibrillation with RVR   Discharged Condition: stable  Hospital Course:  Pt was admitted to telemetry s/p lap chole with IOC by Dr. Harlow Asa 09/05/2018. She had HR 120-130s overnight, but could not tell that she was in a fib. She had not had any recent atrial fibrillation and wasn't being followed by cardiology as an outpatient.  Dr. Einar Gip saw her and got echo which showed good EF and mild LVH.  He converted her from carvedilol to metoprolol and recommended restarting xarelto.  She had no further cardiac issues.  She is doing well on POD 2 without needing pain meds.  She has flatus.  She will be discharged in stable condition.    Consults: cardiology  Significant Diagnostic Studies: echo - discussed above.  Treatments: surgery: see above.    Discharge Exam: Blood pressure (!) 156/91, pulse 73, temperature 98.1 F (36.7 C), temperature source Oral, resp. rate 17, height 5\' 7"  (1.702 m), weight 89.1 kg, SpO2 97 %. General appearance: alert, cooperative and no distress Resp: breathing comfortably Cardio: regular rate and rhythm Extremities: extremities normal, atraumatic, no cyanosis or edema  Abdomen- soft, non distended, mild tenderness at incisions.    Disposition: Discharge disposition: 01-Home or Self Care       Discharge Instructions    Call MD for:  difficulty breathing, headache or visual disturbances   Complete by: As directed    Call MD for:  persistant nausea and vomiting   Complete by: As directed    Call MD for:  redness, tenderness, or signs of infection (pain, swelling, redness, odor or green/yellow discharge around incision site)   Complete by: As  directed    Call MD for:  severe uncontrolled pain   Complete by: As directed    Call MD for:  temperature >100.4   Complete by: As directed    Diet - low sodium heart healthy   Complete by: As directed    Increase activity slowly   Complete by: As directed      Allergies as of 09/06/2018      Reactions   No Known Allergies       Medication List    STOP taking these medications   carvedilol 12.5 MG tablet Commonly known as: COREG     TAKE these medications   ALPRAZolam 0.5 MG tablet Commonly known as: XANAX Take 0.5 mg by mouth 2 (two) times daily as needed for anxiety.   busPIRone 15 MG tablet Commonly known as: BUSPAR Take 15 mg by mouth 2 (two) times daily.   escitalopram 20 MG tablet Commonly known as: LEXAPRO Take 20 mg by mouth every evening.   HYDROcodone-acetaminophen 5-325 MG tablet Commonly known as: NORCO/VICODIN Take 1-2 tablets by mouth every 4 (four) hours as needed for moderate pain.   lisinopril 20 MG tablet Commonly known as: ZESTRIL Take 20 mg by mouth 2 (two) times daily.   metoprolol tartrate 50 MG tablet Commonly known as: LOPRESSOR Take 1 tablet (50 mg total) by mouth 2 (two) times daily.   omeprazole 20 MG capsule Commonly known as: PRILOSEC Take 20 mg by mouth every other day.   rivaroxaban 20 MG Tabs tablet Commonly known as: XARELTO Take 20 mg by mouth daily with supper.   Systane  Balance 0.6 % Soln Generic drug: Propylene Glycol Place 1 drop into both eyes as needed (dry eyes).      Follow-up Information    Adrian Prows, MD Follow up on 09/26/2018.   Specialty: Cardiology Why: 9AM appointment. Bring all medications Contact information: [place] [id number] [phone]           Signed: Stark Klein 09/06/2018, 8:38 AM

## 2018-09-26 ENCOUNTER — Ambulatory Visit: Payer: Medicare Other | Admitting: Cardiology

## 2018-10-02 ENCOUNTER — Encounter: Payer: Self-pay | Admitting: Cardiology

## 2018-10-03 ENCOUNTER — Ambulatory Visit (INDEPENDENT_AMBULATORY_CARE_PROVIDER_SITE_OTHER): Payer: Medicare Other | Admitting: Cardiology

## 2018-10-03 ENCOUNTER — Other Ambulatory Visit: Payer: Self-pay

## 2018-10-03 ENCOUNTER — Encounter: Payer: Self-pay | Admitting: Cardiology

## 2018-10-03 VITALS — BP 131/78 | HR 76 | Ht 67.0 in | Wt 197.4 lb

## 2018-10-03 DIAGNOSIS — E669 Obesity, unspecified: Secondary | ICD-10-CM

## 2018-10-03 DIAGNOSIS — I10 Essential (primary) hypertension: Secondary | ICD-10-CM

## 2018-10-03 DIAGNOSIS — I4891 Unspecified atrial fibrillation: Secondary | ICD-10-CM | POA: Diagnosis not present

## 2018-10-03 MED ORDER — METOPROLOL TARTRATE 50 MG PO TABS: 50.0000 mg | ORAL_TABLET | Freq: Two times a day (BID) | ORAL | 3 refills | Status: DC

## 2018-10-03 NOTE — Progress Notes (Signed)
Primary Physician:  Josem Kaufmann, MD   Patient ID: Rebecca Houston, female    DOB: [birth year], 73 y.o.   MRN: [id number]  Subjective:    Chief Complaint  Patient presents with  . Atrial Fibrillation  . Hypertension  . New Patient (Initial Visit)    HPI: Rebecca Houston  is a 73 y.o. female  with  hypertension, GERD, chronic back pain, admitted to the hospital for laparoscopic cholecystectomy, did well and last evening developed atrial fibrillation with rapid ventricular response.  Patient lives in [place], wants to establish with cardiology here. Seen during recent hospital stay for A fib and now presents for follow up.  Coreg was changed to metorpolol. Tolerating this well and is now back on Xarelto. She has resumed normal activities.She reports previously having stress test and echocardiogram 2 years ago that were normal.   She had previously been found to have A  Fib 4-5 years ago with only occasional breakthrough episodes. Previously was symptomatic with these; however, is now asymptomatic.    Past Medical History:  Diagnosis Date  . A-fib ([place])   . Anxiety   . Arthritis   . Basal cell carcinoma   . Brain tumor (benign) ([place])   . Bruises easily   . Cancer ([place])    melanoma on neck  . Depression   . Heartburn   . History of kidney stones   . Hypertension   . ILD (interstitial lung disease) ([place]) 01/2018   Early  . Lumbar foraminal stenosis    L5-S1  . Melanoma ([place])   . PONV (postoperative nausea and vomiting)   . Psoriasis   . Pulmonary nodules    Stable bilateral sub-6 mm pulmonary nodules   . Renal cyst     Past Surgical History:  Procedure Laterality Date  . ABDOMINAL EXPOSURE N/A 04/02/2016   Procedure: ABDOMINAL EXPOSURE;  Surgeon: Rosetta Posner, MD;  Location: [facility];  Service: Vascular;  Laterality: N/A;  . ABDOMINAL HYSTERECTOMY    . ANTERIOR CERVICAL DECOMP/DISCECTOMY FUSION N/A 04/16/2017   Procedure: Cervical Four-Five, Cervical Five-Six,  Cervical Six-Seven  Anterior cervical decompression/discectomy, fusion;  Surgeon: Erline Levine, MD;  Location: [facility];  Service: Neurosurgery;  Laterality: N/A;  C4-5 C5-6 C6-7 Anterior cervical decompression/discectomy, fusion  . ANTERIOR LUMBAR FUSION N/A 04/02/2016   Procedure: Lumbar five-Sacral one  Anterior lumbar interbody fusion with Dr. Curt Jews;  Surgeon: Erline Levine, MD;  Location: [facility];  Service: Neurosurgery;  Laterality: N/A;  L5-S1 Anterior lumbar interbody fusion with Dr. Sherren Mocha Early  . BRAIN SURGERY    . BREAST REDUCTION SURGERY    . CATARACT EXTRACTION, BILATERAL    . CHOLECYSTECTOMY N/A 09/04/2018   Procedure: LAPAROSCOPIC CHOLECYSTECTOMY WITH INTRAOPERATIVE CHOLANGIOGRAM;  Surgeon: Armandina Gemma, MD;  Location: [facility];  Service: General;  Laterality: N/A;  . COLONOSCOPY    . GALLBLADDER SURGERY  0723/2020  . NECK SURGERY     bone spur    Social History   Socioeconomic History  . Marital status: Married    Spouse name: Not on file  . Number of children: 3  . Years of education: Not on file  . Highest education level: Not on file  Occupational History  . Not on file  Social Needs  . Financial resource strain: Not on file  . Food insecurity    Worry: Not on file    Inability: Not on file  . Transportation needs    Medical: Not on file  Non-medical: Not on file  Tobacco Use  . Smoking status: Former Smoker    Packs/day: 0.50    Years: 15.00    Pack years: 7.50    Types: Cigarettes    Quit date: 2014    Years since quitting: 6.6  . Smokeless tobacco: Never Used  Substance and Sexual Activity  . Alcohol use: No  . Drug use: No  . Sexual activity: Not on file  Lifestyle  . Physical activity    Days per week: Not on file    Minutes per session: Not on file  . Stress: Not on file  Relationships  . Social Herbalist on phone: Not on file    Gets together: Not on file    Attends religious service: Not on file    Active member of club or  organization: Not on file    Attends meetings of clubs or organizations: Not on file    Relationship status: Not on file  . Intimate partner violence    Fear of current or ex partner: Not on file    Emotionally abused: Not on file    Physically abused: Not on file    Forced sexual activity: Not on file  Other Topics Concern  . Not on file  Social History Narrative  . Not on file    Review of Systems  Constitution: Negative for decreased appetite, malaise/fatigue, weight gain and weight loss.  Eyes: Negative for visual disturbance.  Cardiovascular: Negative for chest pain, claudication, dyspnea on exertion, leg swelling, orthopnea, palpitations and syncope.  Respiratory: Negative for hemoptysis and wheezing.   Endocrine: Negative for cold intolerance and heat intolerance.  Hematologic/Lymphatic: Does not bruise/bleed easily.  Skin: Negative for nail changes.  Musculoskeletal: Positive for back pain and joint pain. Negative for muscle weakness and myalgias.  Gastrointestinal: Negative for abdominal pain, change in bowel habit, nausea and vomiting.  Neurological: Negative for difficulty with concentration, dizziness, focal weakness and headaches.  Psychiatric/Behavioral: Negative for altered mental status and suicidal ideas. The patient is nervous/anxious.   All other systems reviewed and are negative.     Objective:  Blood pressure 131/78, pulse 76, height 5\' 7"  (1.702 m), weight 197 lb 6.4 oz (89.5 kg), SpO2 95 %. Body mass index is 30.92 kg/m.    Physical Exam  Constitutional: She is oriented to person, place, and time. Vital signs are normal. She appears well-developed and well-nourished.  HENT:  Head: Normocephalic and atraumatic.  Neck: Normal range of motion.  Cardiovascular: Normal rate, normal heart sounds and intact distal pulses. An irregularly irregular rhythm present.  Pulmonary/Chest: Effort normal and breath sounds normal. No accessory muscle usage. No respiratory  distress.  Abdominal: Soft. Bowel sounds are normal.  Musculoskeletal: Normal range of motion.  Neurological: She is alert and oriented to person, place, and time.  Skin: Skin is warm and dry.  Vitals reviewed.  Radiology: No results found.  Laboratory examination:    CMP Latest Ref Rng & Units 09/03/2018 04/11/2017 03/27/2016  Glucose 70 - 99 mg/dL 130(H) 170(H) 110(H)  BUN 8 - 23 mg/dL 22 21(H) 19  Creatinine 0.44 - 1.00 mg/dL 0.95 0.95 0.87  Sodium 135 - 145 mmol/L 140 138 139  Potassium 3.5 - 5.1 mmol/L 4.0 3.6 3.9  Chloride 98 - 111 mmol/L 103 102 102  CO2 22 - 32 mmol/L 28 25 27   Calcium 8.9 - 10.3 mg/dL 8.9 8.8(L) 9.5   CBC Latest Ref Rng & Units 09/04/2018 09/03/2018  04/11/2017  WBC 4.0 - 10.5 K/uL - 8.4 7.3  Hemoglobin 12.0 - 15.0 g/dL 12.3 12.4 11.8(L)  Hematocrit 36.0 - 46.0 % 37.2 38.3 35.8(L)  Platelets 150 - 400 K/uL - 193 197   Lipid Panel  No results found for: CHOL, TRIG, HDL, CHOLHDL, VLDL, LDLCALC, LDLDIRECT HEMOGLOBIN A1C No results found for: HGBA1C, MPG TSH No results for input(s): TSH in the last 8760 hours.  PRN Meds:. Medications Discontinued During This Encounter  Medication Reason  . HYDROcodone-acetaminophen (NORCO/VICODIN) 5-325 MG tablet    Current Meds  Medication Sig  . ALPRAZolam (XANAX) 0.5 MG tablet Take 0.5 mg by mouth 2 (two) times daily as needed for anxiety.  . busPIRone (BUSPAR) 15 MG tablet Take 15 mg by mouth 2 (two) times daily.  Marland Kitchen escitalopram (LEXAPRO) 20 MG tablet Take 20 mg by mouth every evening.  Marland Kitchen lisinopril (PRINIVIL,ZESTRIL) 20 MG tablet Take 20 mg by mouth 2 (two) times daily.  . metoprolol tartrate (LOPRESSOR) 50 MG tablet Take 1 tablet (50 mg total) by mouth 2 (two) times daily.  Marland Kitchen omeprazole (PRILOSEC) 20 MG capsule Take 20 mg by mouth every other day.  Marland Kitchen Propylene Glycol (SYSTANE BALANCE) 0.6 % SOLN Place 1 drop into both eyes as needed (dry eyes).  . rivaroxaban (XARELTO) 20 MG TABS tablet Take 20 mg by mouth daily  with supper.    Cardiac Studies:   Echocardiogram 09/05/2018 :   1. The left ventricle has normal systolic function with an ejection fraction of 60-65%. The cavity size was normal. There is mild concentric left ventricular hypertrophy. Left ventricular diastolic Doppler parameters are indeterminate. Indeterminate due to A. Fibrillation. 2. The right ventricle has normal systolic function. The cavity was normal. There is no increase in right ventricular wall thickness. Right ventricular systolic pressure is mildly elevated with an estimated pressure of 35.5 mmHg. 3. Trivial pericardial effusion is present.  Assessment:     ICD-10-CM   1. Atrial fibrillation with controlled ventricular rate (HCC)  I48.91 EKG 12-Lead   CHA2DS2-VASc Score is 3.  Yearly risk of stroke: 3.2%.  2. Essential hypertension  I10   3. Mild obesity  E66.9      CHA2DS2-VASc Score is 3.  Yearly risk of stroke: 3.2%.  Score of 1=1.3; 2=2.2; 3=3.2; 4=4; 5=6.7; 6=9.8; 7=>9.8) -(CHF; HTN; vasc disease DM,  Female = 1; Age <65 =0; 65-74 = 1,  >75 =2; stroke = 2).   EKG  10/03/2018: Normal sinus rhythm at 82 bpm, normal axis, no evidence of ischemia.   Recommendations:   Patient continues to be in atrial fibrillation, but fortunately is asymptomatic.  I do feel that it may be worthwhile to at least try to obtain sinus rhythm with cardioversion; however, if she were to go back into atrial fibrillation, we would accept rate control therapy as she is asymptomatic.  Medical management would also not change.  She wishes to think about cardioversion and discuss with her family and will let us know.  We will continue with current medications as again medical management would not change if she did not have cardioversion.  She has had normal LVEF by recent echocardiogram.  Reports having negative nuclear stress test 2 years ago in [place].  She is been resuming her normal activities and is without any symptoms of angina.  Blood  pressure is well controlled.  She will continue to need risk factor modification weekly with some weight loss.  We will plan to see her back in  6 months unless she decides to have cardioversion, we will see her back after that.   *I have discussed this case with Dr. Einar Gip and he personally examined the patient and participated in formulating the plan.*   Miquel Dunn, MSN, APRN, FNP-C [facility]. [place] Office: [phone] Fax: [phone]

## 2018-10-11 IMAGING — CR DG CERVICAL SPINE 2 OR 3 VIEWS
1 series · 1 of 1 positions shown · non-contrast
Comparison: 03/27/2017

CLINICAL DATA: Cervical fusion

EXAM:
CERVICAL SPINE - 2-3 VIEW

[xtable lateral]
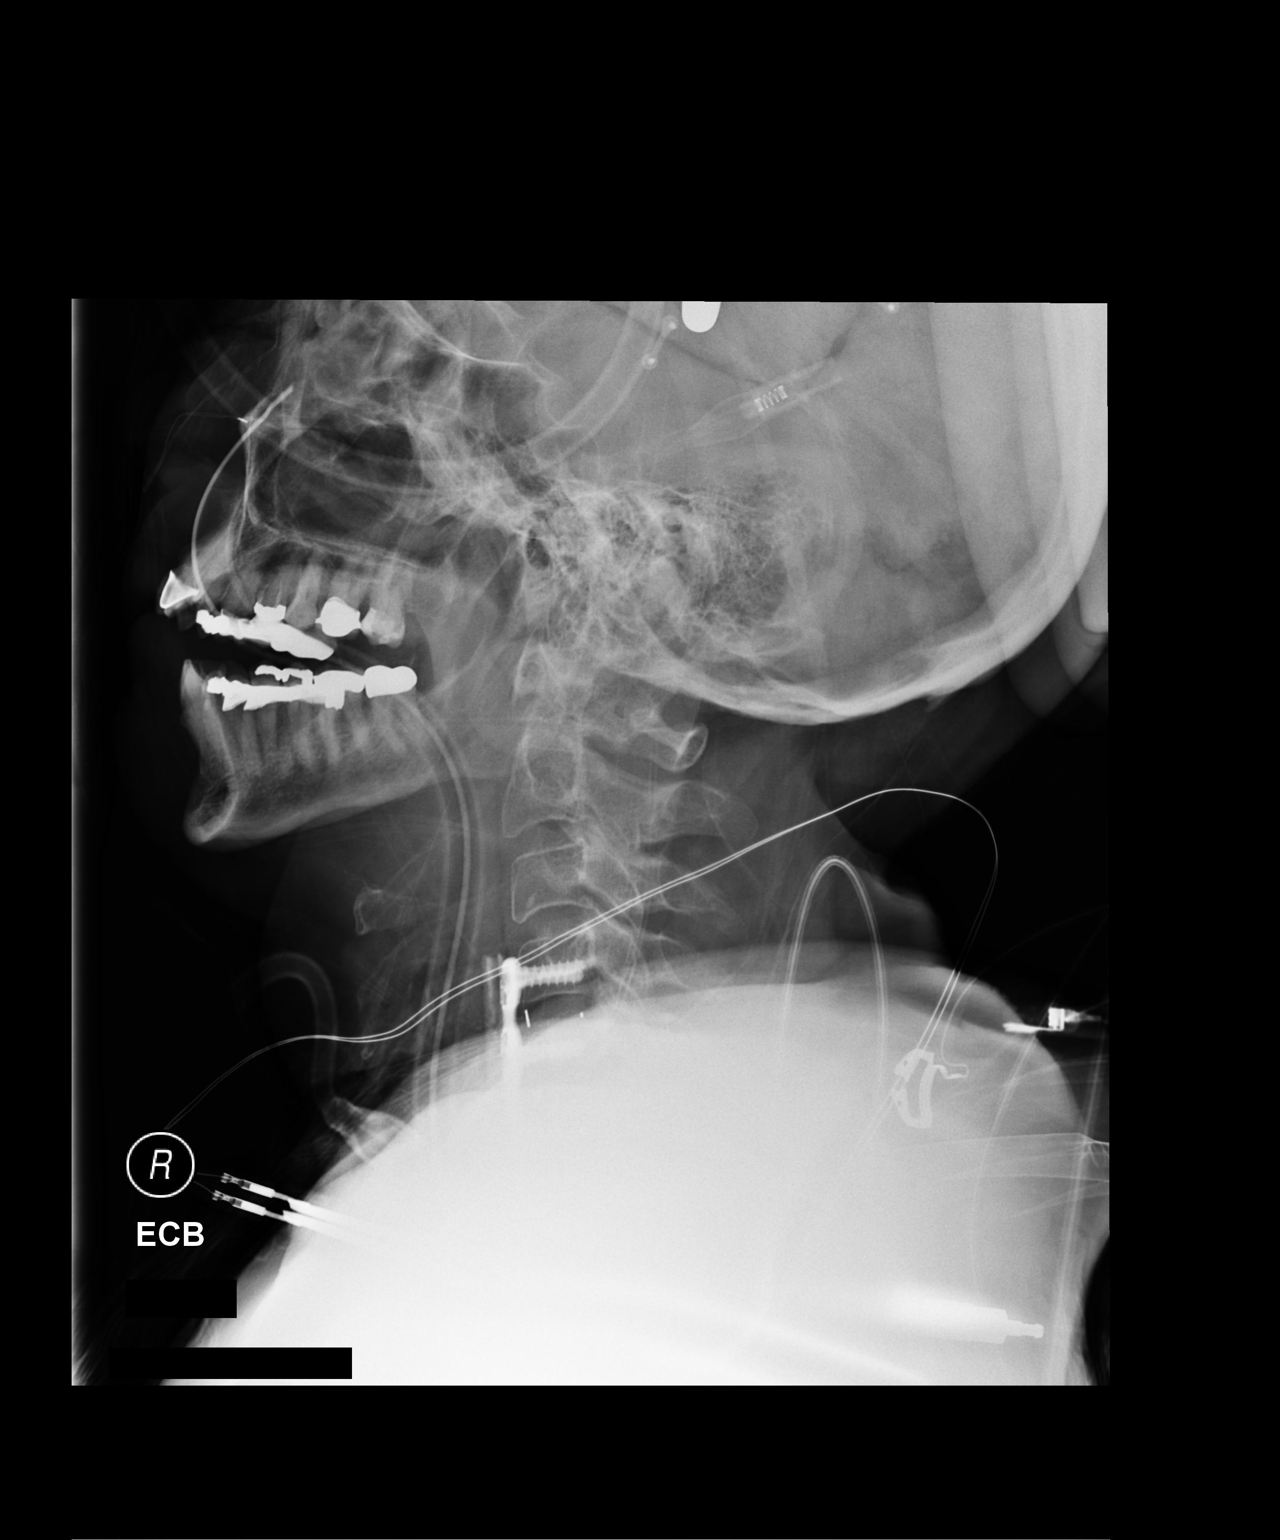

[1 of 1 positions shown; findings below may reference images not displayed]

FINDINGS: ACDF C4 through C6. C6-7 not well visualized due to overlying
shoulders. Normal alignment.
IMPRESSION: ACDF C4-5 and C5-6. C6-7 not adequately visualized due to shoulders.
Follow-up upright x-ray suggested for further evaluation of the
extent of fusion.

## 2018-11-17 ENCOUNTER — Telehealth: Payer: Self-pay

## 2018-11-17 NOTE — Telephone Encounter (Signed)
ERROR

## 2019-01-20 ENCOUNTER — Encounter: Payer: Self-pay | Admitting: Cardiology

## 2019-01-20 ENCOUNTER — Ambulatory Visit (INDEPENDENT_AMBULATORY_CARE_PROVIDER_SITE_OTHER): Payer: Medicare Other | Admitting: Cardiology

## 2019-01-20 ENCOUNTER — Other Ambulatory Visit: Payer: Self-pay

## 2019-01-20 VITALS — BP 155/83 | HR 57 | Temp 97.1°F | Ht 67.0 in | Wt 200.4 lb

## 2019-01-20 DIAGNOSIS — Z79899 Other long term (current) drug therapy: Secondary | ICD-10-CM | POA: Diagnosis not present

## 2019-01-20 DIAGNOSIS — R0989 Other specified symptoms and signs involving the circulatory and respiratory systems: Secondary | ICD-10-CM | POA: Diagnosis not present

## 2019-01-20 DIAGNOSIS — Z7901 Long term (current) use of anticoagulants: Secondary | ICD-10-CM

## 2019-01-20 DIAGNOSIS — I10 Essential (primary) hypertension: Secondary | ICD-10-CM | POA: Diagnosis not present

## 2019-01-20 DIAGNOSIS — Z7189 Other specified counseling: Secondary | ICD-10-CM

## 2019-01-20 DIAGNOSIS — I480 Paroxysmal atrial fibrillation: Secondary | ICD-10-CM

## 2019-01-20 MED ORDER — AMLODIPINE BESYLATE 5 MG PO TABS: 5.0000 mg | ORAL_TABLET | Freq: Every day | ORAL | 11 refills | Status: DC

## 2019-01-20 NOTE — Progress Notes (Signed)
Cardiology Office Note:    Date:  01/20/2019   ID:  Rebecca Houston, DOB [birth year], MRN [id number]  PCP:  Josem Kaufmann, MD  Cardiologist:  Buford Dresser, MD  Referring MD: Josem Kaufmann, MD   CC: new patient evaluation for atrial fibrillation, hypertension, hyperlipidemia  History of Present Illness:    Rebecca Houston is a 73 y.o. female with a hx of labile blood pressure, anxiety/depression who is seen as a new consult at the request of Josem Kaufmann, MD for the evaluation and management of atrial fibrillation, hypertension, hyperlipidemia.  Records received and reviewed from visit 01/13/19 with Dellia Nims, NP at [facility] in [place], [place]. Noted at that time to have dizzy spells, weakness. Blood pressure range noted to be as high as 220/130, but on evaluation at that visit, blood pressure was 90/44. She was given 1 liter of normal saline in the office. She had been on clonidine, which was held at that time.   Patient reported history:  Sister passed away Dec 24, 2018 from cancer. They were very close. Blood pressure started to be elevated after her sister passed away. Husband also has lung cancer, though stable, and son has multiple myeloma on chemo. Also recently lost two close friends. Has been under a great deal of stress related to these events. Feels that her anxiety is not fully managed yet.  Prior to recent month or so, had not had many issues with blood pressure. However, on 12/15/18 she underwent laser treatment of her eye, and her BP was 212/117 per her report (documentation I can see does not have any charted blood pressures). She was referred back to her PCP to manage this. She was placed on clonidine 0.1 mg BID approximately 2-3 weeks ago. Had a low blood pressure at the visit noted above on 01/13/19, was taken off the clonidine briefly, then restarted on 40/9 PRN for diastolic blood pressure over 100.   Has been on xarelto for paroxysmal afib for years, rare  issues (noted around the time of procedures). This occurred with laparoscopic cholecystectomy surgery 08/2018. She was seen by Dr. Einar Gip and [facility] during that admission. Echo done was unremarkable.  She brings a list of her current medication regimen: AM: HCTZ 25 mg, lisinopril 20 mg, metoprolol 50 mg, buspar 15 mg Noon: Buspar '15mg'$  PM: xanax 0.5 mg, escitalopram 20 mg, lisinopril 20 mg, metoprolol 50 mg, xarelto 20 mg, buspar 16 mg, bupropion 150 mg  PRN clonidine 0.1 mg for diastolic BP >811  She also has a history of BP measurements and med changes: 01/13/19 (day of PCP visit): BP 90/44, given 1 L NS in office. HCTZ and clonidine held 01/16/19: AM BP 200/99. Took clonidine in AM 01/18/19: AM 218/101. Took extra Xanax and took clonidine. Recheck 3:45 PM 209/104, took clonidine. Recheck 9:20 PM 202/93 01/19/19: AM 202/122, took clonidine, morning meds, and xanax. Recheck 8:30 AM 189/101. Recheck 9:30 AM 182/116. Recheck 10:50 AM 200/131. Recheck 12 PM 189/109, took clonidine. Recheck 1 PM 193/110. Recheck 3 PM 142/85. Recheck 5 PM 125/85. Recheck 7 PM 121/74, held PM lisinopril. 01/20/19 AM 168/114, took all AM meds plus Xanax  Denies chest pain, shortness of breath at rest or with normal exertion. No PND, orthopnea, LE edema or unexpected weight gain. No syncope or palpitations.  Past Medical History:  Diagnosis Date   A-fib [facility])    Anxiety    Arthritis    Basal cell carcinoma    Brain tumor (benign) (  [place])    Bruises easily    Cancer ([place])    melanoma on neck   Depression    Heartburn    History of kidney stones    Hypertension    ILD (interstitial lung disease) ([place]) 01/2018   Early   Lumbar foraminal stenosis    L5-S1   Melanoma (HCC)    PONV (postoperative nausea and vomiting)    Psoriasis    Pulmonary nodules    Stable bilateral sub-6 mm pulmonary nodules    Renal cyst     Past Surgical History:  Procedure Laterality Date   ABDOMINAL  EXPOSURE N/A 04/02/2016   Procedure: ABDOMINAL EXPOSURE;  Surgeon: Rosetta Posner, MD;  Location: [facility];  Service: Vascular;  Laterality: N/A;   ABDOMINAL HYSTERECTOMY     ANTERIOR CERVICAL DECOMP/DISCECTOMY FUSION N/A 04/16/2017   Procedure: Cervical Four-Five, Cervical Five-Six, Cervical Six-Seven  Anterior cervical decompression/discectomy, fusion;  Surgeon: Erline Levine, MD;  Location: [employer];  Service: Neurosurgery;  Laterality: N/A;  C4-5 C5-6 C6-7 Anterior cervical decompression/discectomy, fusion   ANTERIOR LUMBAR FUSION N/A 04/02/2016   Procedure: Lumbar five-Sacral one  Anterior lumbar interbody fusion with Dr. Curt Jews;  Surgeon: Erline Levine, MD;  Location: [facility];  Service: Neurosurgery;  Laterality: N/A;  L5-S1 Anterior lumbar interbody fusion with Dr. Sherren Mocha [place]   [place]     CATARACT EXTRACTION, BILATERAL     CHOLECYSTECTOMY N/A 09/04/2018   Procedure: LAPAROSCOPIC CHOLECYSTECTOMY WITH INTRAOPERATIVE CHOLANGIOGRAM;  Surgeon: Armandina Gemma, MD;  Location: [facility];  Service: General;  Laterality: N/A;   COLONOSCOPY     GALLBLADDER SURGERY  0723/2020   NECK SURGERY     bone spur    Current Medications: Current Outpatient Medications on File Prior to Visit  Medication Sig   ALPRAZolam (XANAX) 0.5 MG tablet Take 0.5 mg by mouth 2 (two) times daily as needed for anxiety.   busPIRone (BUSPAR) 15 MG tablet Take 15 mg by mouth 2 (two) times daily.   escitalopram (LEXAPRO) 20 MG tablet Take 20 mg by mouth every evening.   lisinopril (PRINIVIL,ZESTRIL) 20 MG tablet Take 20 mg by mouth 2 (two) times daily.   metoprolol tartrate (LOPRESSOR) 50 MG tablet Take 1 tablet (50 mg total) by mouth 2 (two) times daily.   omeprazole (PRILOSEC) 20 MG capsule Take 20 mg by mouth every other day.   Propylene Glycol (SYSTANE BALANCE) 0.6 % SOLN Place 1 drop into both eyes as needed (dry eyes).   rivaroxaban (XARELTO) 20 MG TABS tablet Take 20 mg by mouth  daily with supper.   No current facility-administered medications on file prior to visit.      Allergies:   No known allergies   Social History   Tobacco Use   Smoking status: Former Smoker    Packs/day: 0.50    Years: 15.00    Pack years: 7.50    Types: Cigarettes    Quit date: 2014    Years since quitting: 6.9   Smokeless tobacco: Never Used  Substance Use Topics   Alcohol use: No   Drug use: No    Family History: family history includes Breast cancer in her sister; Dementia in her mother; Heart disease in her father; Stroke in her mother.  ROS:   Please see the history of present illness.  Additional pertinent ROS: Constitutional: Negative for chills, fever, night sweats, unintentional weight loss  HENT: Negative for ear pain and hearing loss.  Eyes: Negative for loss of vision and eye pain.  Respiratory: Negative for cough, sputum, wheezing.   Cardiovascular: See HPI. Gastrointestinal: Negative for abdominal pain, melena, and hematochezia.  Genitourinary: Negative for dysuria and hematuria.  Musculoskeletal: Negative for falls and myalgias.  Skin: Negative for itching and rash.  Neurological: Negative for focal weakness, focal sensory changes and loss of consciousness.  Endo/Heme/Allergies: Does not bruise/bleed easily.     EKGs/Labs/Other Studies Reviewed:    The following studies were reviewed today: Echo 7.24.20  1. The left ventricle has normal systolic function with an ejection fraction of 60-65%. The cavity size was normal. There is mild concentric left ventricular hypertrophy. Left ventricular diastolic Doppler parameters are indeterminate. Indeterminate due  to A. Fibrillation.  2. The right ventricle has normal systolic function. The cavity was normal. There is no increase in right ventricular wall thickness. Right ventricular systolic pressure is mildly elevated with an estimated pressure of 35.5 mmHg.  3. Trivial pericardial effusion is present.   4. The aorta is normal in size and structure.  5. The aortic root is normal in size and structure.  EKG:  EKG is personally reviewed.  The ekg ordered today demonstrates sinus bradycardia with sinus arrhythmia, HR 57 bpm  Recent Labs: 09/03/2018: BUN 22; Creatinine, Ser 0.95; Platelets 193; Potassium 4.0; Sodium 140 09/04/2018: Hemoglobin 12.3  Recent Lipid Panel No results found for: CHOL, TRIG, HDL, CHOLHDL, VLDL, LDLCALC, LDLDIRECT  Physical Exam:    VS:  BP (!) 155/83    Pulse (!) 57    Temp (!) 97.1 F (36.2 C)    Ht '5\' 7"'$  (1.702 m)    Wt 200 lb 6.4 oz (90.9 kg)    SpO2 99%    BMI 31.39 kg/m     Wt Readings from Last 3 Encounters:  01/30/19 195 lb (88.5 kg)  01/20/19 200 lb 6.4 oz (90.9 kg)  10/03/18 197 lb 6.4 oz (89.5 kg)    GEN: Well nourished, well developed in no acute distress HEENT: Normal, moist mucous membranes NECK: No JVD CARDIAC: regular rhythm, normal S1 and S2, no rubs or gallops. No murmurs. VASCULAR: Radial and DP pulses 2+ bilaterally. No carotid bruits RESPIRATORY:  Clear to auscultation without rales, wheezing or rhonchi  ABDOMEN: Soft, non-tender, non-distended MUSCULOSKELETAL:  Ambulates independently SKIN: Warm and dry, no edema NEUROLOGIC:  Alert and oriented x 3. No focal neuro deficits noted. PSYCHIATRIC:  Normal affect    ASSESSMENT:    1. Labile hypertension   2. Essential hypertension   3. Medication management   4. Cardiac risk counseling   5. Counseling on health promotion and disease prevention   6. Paroxysmal atrial fibrillation (HCC)   7. Long term current use of anticoagulant    PLAN:    Labile hypertension, with episodic hypotension:  I am concerned about her variation. We spoke at length about blood pressure goal, symptoms to watch for with both very high and very low readings, when BP numbers are a medical emergency. Given instructions, below.   I would like to get her off the clonidine. She is taking this PRN for now and only  for a few days. She is already on beta blocker. Given all of this, will stop PRN doses. Instructed on symptoms to watch for.  Will start amlodipine to see if this offers better control of her BP without as many swings. Will start below max dose. Instructed on what to watch for in terms of side effects.  Stop: Clonidine  0.1 mg  Start: Amlodipine 5 mg daily Continue hydrochlorothiazide, lisinopril (max 40 mg daily, once daily or split 20 mg twice daily is fine), and metoprolol 50 mg twice daily.  -counseled on how to check blood pressure:  -sit comfortably in a chair, feet uncrossed and flat on floor, for 5-10 minutes  -arm ideally should rest at the level of the heart. However, arm should be relaxed and not tense (for example, do not hold the arm up unsupported)  -avoid exercise, caffeine, and tobacco for at least 30 minutes prior to BP reading  -don't take BP cuff reading over clothes (always place on skin directly)  -I prefer to know how well the medication is working, so I would like you to take your readings 1-2 hours after taking your blood pressure medication if possible  Check blood pressure once a day, or more only if feeling bad.   If an as needed medication is needed, I would much prefer something like hydralazine over clonidine. I do not think we need it yet, but if it is needed in the future.  The blood pressure number is not the primary focus--it is SYMPTOMS. If you feel well and your top blood pressure is 200, it's ok. If you have a headache, chest pain, vision/hearing/speech changes, loss of sensation or muscle weakness, please seek emergency medical care.  With questions that can get answers within 48 hours, best to [employer]. If you need to speak to a person, please call the office (there may be a wait, but we will get you to a nurse).  Paroxysmal atrial fibrillation: episodes tend to be related to procedures/surgeries -CHA2DS2/VAS Stroke Risk Points= 3 -sinus  bradycardia on ECG today -rate control with metoprolol -long term anticoagulation with rivaroxaban  Cardiac risk counseling and prevention recommendations: -recommend heart healthy/Mediterranean diet, with whole grains, fruits, vegetable, fish, lean meats, nuts, and olive oil. Limit salt. -recommend moderate walking, 3-5 times/week for 30-50 minutes each session. Aim for at least 150 minutes.week. Goal should be pace of 3 miles/hours, or walking 1.5 miles in 30 minutes -recommend avoidance of tobacco products. Avoid excess alcohol.  Plan for follow up: 10-14 days for close follow up of blood pressure.  Total time of encounter: 65 minutes total time of encounter, including 45 minutes spent in face-to-face patient care. This time includes coordination of care and counseling regarding labile blood pressure, education, and plan for management. Remainder of non-face-to-face time involved reviewing chart documents/testing relevant to the patient encounter and documentation in the medical record.  Buford Dresser, MD, PhD [place]   [facility]   Medication Adjustments/Labs and Tests Ordered: Current medicines are reviewed at length with the patient today.  Concerns regarding medicines are outlined above.  Orders Placed This Encounter  Procedures   EKG 12-Lead   Meds ordered this encounter  Medications   DISCONTD: amLODipine (NORVASC) 5 MG tablet    Sig: Take 1 tablet (5 mg total) by mouth daily.    Dispense:  30 tablet    Refill:  11    Patient Instructions  Medication Instructions:  Stop: Clonidine 0.1 mg  Start: Amlodipine 5 mg daily Continue hydrochlorothiazide, lisinopril (max 40 mg daily, once daily or split 20 mg twice daily is fine), and metoprolol 50 mg twice daily.  *If you need a refill on your cardiac medications before your next appointment, please call your pharmacy*  Lab Work: None  Testing/Procedures: None  Follow-Up: At [facility], you and  your health needs  are our priority.  As part of our continuing mission to provide you with exceptional heart care, we have created designated Provider Care Teams.  These Care Teams include your primary Cardiologist (physician) and Advanced Practice Providers (APPs -  Physician Assistants and Nurse Practitioners) who all work together to provide you with the care you need, when you need it.  Your next appointment:   10-14 day(s)  The format for your next appointment:   Virtual Visit   Provider:   Buford Dresser, MD   -counseled on how to check blood pressure:  -sit comfortably in a chair, feet uncrossed and flat on floor, for 5-10 minutes  -arm ideally should rest at the level of the heart. However, arm should be relaxed and not tense (for example, do not hold the arm up unsupported)  -avoid exercise, caffeine, and tobacco for at least 30 minutes prior to BP reading  -don't take BP cuff reading over clothes (always place on skin directly)  -I prefer to know how well the medication is working, so I would like you to take your readings 1-2 hours after taking your blood pressure medication if possible  Check blood pressure once a day, or more only if feeling bad.   If an as needed medication is needed, I would much prefer something like hydralazine over clonidine. I do not think we need it yet, but if it is needed in the future.  The blood pressure number is not the primary focus--it is SYMPTOMS. If you feel well and your top blood pressure is 200, it's ok. If you have a headache, chest pain, vision/hearing/speech changes, loss of sensation or muscle weakness, please seek emergency medical care.  With questions that can get answers within 48 hours, best to [employer]. If you need to speak to a person, please call the office (there may be a wait, but we will get you to a nurse).   Signed, Buford Dresser, MD PhD 01/20/2019  [place]

## 2019-01-20 NOTE — Patient Instructions (Addendum)
Medication Instructions:  Stop: Clonidine 0.1 mg  Start: Amlodipine 5 mg daily Continue hydrochlorothiazide, lisinopril (max 40 mg daily, once daily or split 20 mg twice daily is fine), and metoprolol 50 mg twice daily.  *If you need a refill on your cardiac medications before your next appointment, please call your pharmacy*  Lab Work: None  Testing/Procedures: None  Follow-Up: At [facility], you and your health needs are our priority.  As part of our continuing mission to provide you with exceptional heart care, we have created designated Provider Care Teams.  These Care Teams include your primary Cardiologist (physician) and Advanced Practice Providers (APPs -  Physician Assistants and Nurse Practitioners) who all work together to provide you with the care you need, when you need it.  Your next appointment:   10-14 day(s)  The format for your next appointment:   Virtual Visit   Provider:   Buford Dresser, MD   -counseled on how to check blood pressure:  -sit comfortably in a chair, feet uncrossed and flat on floor, for 5-10 minutes  -arm ideally should rest at the level of the heart. However, arm should be relaxed and not tense (for example, do not hold the arm up unsupported)  -avoid exercise, caffeine, and tobacco for at least 30 minutes prior to BP reading  -don't take BP cuff reading over clothes (always place on skin directly)  -I prefer to know how well the medication is working, so I would like you to take your readings 1-2 hours after taking your blood pressure medication if possible  Check blood pressure once a day, or more only if feeling bad.   If an as needed medication is needed, I would much prefer something like hydralazine over clonidine. I do not think we need it yet, but if it is needed in the future.  The blood pressure number is not the primary focus--it is SYMPTOMS. If you feel well and your top blood pressure is 200, it's ok. If you have a  headache, chest pain, vision/hearing/speech changes, loss of sensation or muscle weakness, please seek emergency medical care.  With questions that can get answers within 48 hours, best to [employer]. If you need to speak to a person, please call the office (there may be a wait, but we will get you to a nurse).

## 2019-01-26 ENCOUNTER — Telehealth: Payer: Self-pay | Admitting: Cardiology

## 2019-01-26 NOTE — Telephone Encounter (Signed)
Pt c/o medication issue:  1. Name of Medication: amLODipine (NORVASC) 5 MG tablet  2. How are you currently taking this medication (dosage and times per day)? As directed  3. Are you having a reaction (difficulty breathing--STAT)? no  4. What is your medication issue? Patient fainted Saturday. She said this never happens to her, and the only thing that is different is the medication that Dr. Harrell Gave put her on at her first appointment. She has taken the medication since, but she is afraid to go anywhere and have this happen again.   The patient is going to another Doctor's appointment this morning and may not be able to pick up her phone right away

## 2019-01-26 NOTE — Telephone Encounter (Addendum)
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  She does not know how long she was unconscious but she found the shelf she was working on, on top of her.   She had a headache after from hitting her head that was relieved with Tylenol. She did not go to the ED.. her daughter, an Therapist, sports, assessed her and did not feel she need to go. Pt is on Xarelto but no symptoms of headache, dizziness since the initial headache.   Pt has been checking her BP simce her OV 01/20/19 after taking her a.m. meds.   12/9 201/88 HR 51 12/10 160/69 HR 69 12/11 150/100 HR 76 12/12 164/94 HR 79 12/13 154/79 HR 51 121/4 170/76 HR 52  She is unsure of her BP around the time of the syncopal episode.   Pt has felt well since. She is on her way to the Dermatologist at [place] now her husband is driving.   Will forward to Dr. Harrell Gave for review and recommendations.

## 2019-01-27 NOTE — Telephone Encounter (Signed)
Patient calling to check on status of yesterday phone call.

## 2019-01-27 NOTE — Telephone Encounter (Signed)
I cannot determine what caused the episode, but if it is related to blood pressure, is it usually low blood pressure. None of the readings listed are low, and most are actually above goal. It does not appear that the amlodipine is the likely cause of the episode, and based on the numbers she needs improved BP control. I cannot assess her, but a continued headache after a fall that involves hitting the head while on blood thinner is concerning. If headache persists or she has any neuro changes (vision, hearing, speech, weakness, sensation, etc) she should go to ER for heat CT. I cannot determine what the event was, but it is important for her to see someone to discuss. She can see her PCP or we can try to have her seen. If she had no warning prior to the event, there are [place] rules about driving. If she had warning symptoms, this is different. She will need someone to review this with her. No reason at this time she cannot continue the medication. Dr. Harrell Gave

## 2019-01-27 NOTE — Telephone Encounter (Signed)
Pt updated with MD's recommendation and voiced understanding. Pt report she will contact pcp for further evaluations and keep scheduled virtual appointment on 12/18 with Dr. Harrell Gave.

## 2019-01-30 ENCOUNTER — Telehealth (INDEPENDENT_AMBULATORY_CARE_PROVIDER_SITE_OTHER): Payer: Medicare Other | Admitting: Cardiology

## 2019-01-30 ENCOUNTER — Encounter: Payer: Self-pay | Admitting: Cardiology

## 2019-01-30 VITALS — BP 128/69 | HR 53 | Ht 67.0 in | Wt 195.0 lb

## 2019-01-30 DIAGNOSIS — R55 Syncope and collapse: Secondary | ICD-10-CM | POA: Diagnosis not present

## 2019-01-30 DIAGNOSIS — I10 Essential (primary) hypertension: Secondary | ICD-10-CM

## 2019-01-30 NOTE — Patient Instructions (Signed)
Medication Instructions:  Your Physician recommend you continue on your current medication as directed.    *If you need a refill on your cardiac medications before your next appointment, please call your pharmacy*  Lab Work: None  Testing/Procedures: None  Follow-Up: At [facility], you and your health needs are our priority.  As part of our continuing mission to provide you with exceptional heart care, we have created designated Provider Care Teams.  These Care Teams include your primary Cardiologist (physician) and Advanced Practice Providers (APPs -  Physician Assistants and Nurse Practitioners) who all work together to provide you with the care you need, when you need it.  Your next appointment:   1 month(s)  The format for your next appointment:   Virtual Visit   Provider:   Buford Dresser, MD

## 2019-01-30 NOTE — Progress Notes (Signed)
Virtual Visit via Video Note   This visit type was conducted due to national recommendations for restrictions regarding the COVID-19 Pandemic (e.g. social distancing) in an effort to limit this patient's exposure and mitigate transmission in our community.  Due to her co-morbid illnesses, this patient is at least at moderate risk for complications without adequate follow up.  This format is felt to be most appropriate for this patient at this time.  All issues noted in this document were discussed and addressed.  A limited physical exam was performed with this format.  Please refer to the patient's chart for her consent to telehealth for [facility].   Date:  01/30/2019   ID:  Rebecca Houston, DOB 1945-11-22, MRN [id number]  Patient Location: Home Provider Location: Office  PCP:  Josem Kaufmann, MD  Cardiologist:  Buford Dresser, MD  Electrophysiologist:  None   Evaluation Performed:  Follow-Up Visit  Chief Complaint:  syncope  History of Present Illness:    Rebecca Houston is a 73 y.o. female with hypertension, atrial fibrillation, hyperlipidemia. She is seen urgently for virtual follow up after an episode of syncope.  The patient does not have symptoms concerning for COVID-19 infection (fever, chills, cough, or new shortness of breath).   Earlier this week, she was putting up Christmas decorations at her house. She felt lightheaded, laid head down. Usually feels better with this, but this episode, she fainted and pulled a bookcase over on herself. She woke up and was able to get herself to a chair and her phone. She called her children--daughter is nurse, son is emt. She felt she was better by the time they got there. She hit her head and had a mild headache, relieved with tylenol. Her children assessed her, and she was monitored at home (did not go to ER). She feels much better.  She saw her PCP earlier this week, no clear etiology discovered. She had carotid scans done today,  awaiting results.  Since the episode, she has felt well. Her blood pressures are under much better control. No lows, have come down from mostly [id number] systolic to [id number] systolic. She feels well. No lightheadedness or dizzyness.   We reviewed her episode. She did have a prodrome, and she tried to sit down and lower her head. She has had these episodes in the past, though not recently. She has been drinking a lot of water and feels she is well hydrated. Also ate a good breakfast the morning of the event. No clear triggers. No post-syncopal symptoms. No heart racing prior. Has not felt any afib.  Denies chest pain, shortness of breath at rest or with normal exertion. No PND, orthopnea, LE edema or unexpected weight gain. No palpitations.   Past Medical History:  Diagnosis Date  . A-fib ([place])   . Anxiety   . Arthritis   . Basal cell carcinoma   . Brain tumor (benign) ([place])   . Bruises easily   . Cancer ([place])    melanoma on neck  . Depression   . Heartburn   . History of kidney stones   . Hypertension   . ILD (interstitial lung disease) ([place]) 01/2018   Early  . Lumbar foraminal stenosis    L5-S1  . Melanoma ([place])   . PONV (postoperative nausea and vomiting)   . Psoriasis   . Pulmonary nodules    Stable bilateral sub-6 mm pulmonary nodules   . Renal cyst    Past Surgical History:  Procedure Laterality  Date  . ABDOMINAL EXPOSURE N/A 04/02/2016   Procedure: ABDOMINAL EXPOSURE;  Surgeon: Rosetta Posner, MD;  Location: [place];  Service: Vascular;  Laterality: N/A;  . ABDOMINAL HYSTERECTOMY    . ANTERIOR CERVICAL DECOMP/DISCECTOMY FUSION N/A 04/16/2017   Procedure: Cervical Four-Five, Cervical Five-Six, Cervical Six-Seven  Anterior cervical decompression/discectomy, fusion;  Surgeon: Erline Levine, MD;  Location: [place];  Service: Neurosurgery;  Laterality: N/A;  C4-5 C5-6 C6-7 Anterior cervical decompression/discectomy, fusion  . ANTERIOR LUMBAR FUSION N/A 04/02/2016   Procedure: Lumbar  five-Sacral one  Anterior lumbar interbody fusion with Dr. Curt Jews;  Surgeon: Erline Levine, MD;  Location: [place];  Service: Neurosurgery;  Laterality: N/A;  L5-S1 Anterior lumbar interbody fusion with Dr. Sherren Mocha Early  . BRAIN SURGERY    . BREAST REDUCTION SURGERY    . CATARACT EXTRACTION, BILATERAL    . CHOLECYSTECTOMY N/A 09/04/2018   Procedure: LAPAROSCOPIC CHOLECYSTECTOMY WITH INTRAOPERATIVE CHOLANGIOGRAM;  Surgeon: Armandina Gemma, MD;  Location: [facility];  Service: General;  Laterality: N/A;  . COLONOSCOPY    . GALLBLADDER SURGERY  0723/2020  . NECK SURGERY     bone spur     Current Meds  Medication Sig  . ALPRAZolam (XANAX) 0.5 MG tablet Take 0.5 mg by mouth 2 (two) times daily as needed for anxiety.  Marland Kitchen amLODipine (NORVASC) 5 MG tablet Take 5 mg by mouth 2 (two) times daily.  Marland Kitchen buPROPion (WELLBUTRIN XL) 150 MG 24 hr tablet Take 150 mg by mouth daily.  . busPIRone (BUSPAR) 15 MG tablet Take 15 mg by mouth 3 (three) times daily.  Marland Kitchen escitalopram (LEXAPRO) 20 MG tablet Take 20 mg by mouth every evening.  . hydrochlorothiazide (HYDRODIURIL) 25 MG tablet Take 25 mg by mouth daily.  Marland Kitchen lisinopril (PRINIVIL,ZESTRIL) 20 MG tablet Take 20 mg by mouth 2 (two) times daily.  . metoprolol tartrate (LOPRESSOR) 50 MG tablet Take 1 tablet (50 mg total) by mouth 2 (two) times daily.  Marland Kitchen omeprazole (PRILOSEC) 20 MG capsule Take 20 mg by mouth every other day.  Marland Kitchen Propylene Glycol (SYSTANE BALANCE) 0.6 % SOLN Place 1 drop into both eyes as needed (dry eyes).  . rivaroxaban (XARELTO) 20 MG TABS tablet Take 20 mg by mouth daily with supper.     Allergies:   No known allergies   Social History   Tobacco Use  . Smoking status: Former Smoker    Packs/day: 0.50    Years: 15.00    Pack years: 7.50    Types: Cigarettes    Quit date: 2014    Years since quitting: 6.9  . Smokeless tobacco: Never Used  Substance Use Topics  . Alcohol use: No  . Drug use: No     Family Hx: The patient's family history  includes Breast cancer in her sister; Dementia in her mother; Heart disease in her father; Stroke in her mother.  ROS:   Please see the history of present illness.    All other systems reviewed and are negative.   Prior CV studies:   The following studies were reviewed today: Prior echo  Labs/Other Tests and Data Reviewed:    EKG:  No new today  Recent Labs: 09/03/2018: BUN 22; Creatinine, Ser 0.95; Platelets 193; Potassium 4.0; Sodium 140 09/04/2018: Hemoglobin 12.3   Recent Lipid Panel No results found for: CHOL, TRIG, HDL, CHOLHDL, LDLCALC, LDLDIRECT  Wt Readings from Last 3 Encounters:  01/30/19 195 lb (88.5 kg)  01/20/19 200 lb 6.4 oz (90.9 kg)  10/03/18  197 lb 6.4 oz (89.5 kg)     Objective:    Vital Signs:  BP 128/69   Pulse (!) 53   Ht 5\' 7"  (1.702 m)   Wt 195 lb (88.5 kg)   BMI 30.54 kg/m    VITAL SIGNS:  reviewed GEN:  no acute distress EYES:  sclerae anicteric, EOMI - Extraocular Movements Intact RESPIRATORY:  normal respiratory effort, symmetric expansion CARDIOVASCULAR:  no visible JVD SKIN:  no rash, lesions or ulcers. MUSCULOSKELETAL:  no obvious deformities. NEURO:  alert and oriented x 3, no obvious focal deficit PSYCH:  normal affect  ASSESSMENT & PLAN:    Syncope: had prodrome. Did have injury as a result of pulling bookcase/shelf on herself. No recurrence, feeling better now. No clear triggers. No cardiovascular symptoms of chest pain or palpitations prior. -reviewed that if she feels lightheaded again, she should lay all the way on the floor, and ideally she should elevate her legs to improve blood flow (prop on chair, etc) -she did have prodrome prior. Counseled that if she has event without prodrome, she cannot drive -BP have been good, no lows (actually several highs) -no significant bradycardia, no palpitations. However, if symptoms recur, would consider monitor at that time -recent echo unremarkable -had carotids done today, pending  results  Hypertension: control much improved, tolerating medications. Will continue to follow closely given recent syncope   COVID-19 Education: The signs and symptoms of COVID-19 were discussed with the patient and how to seek care for testing (follow up with PCP or arrange E-visit).  The importance of social distancing was discussed today.  Time:   Today, I have spent 15 minutes with the patient with telehealth technology discussing the above problems.    Patient Instructions  Medication Instructions:  Your Physician recommend you continue on your current medication as directed.    *If you need a refill on your cardiac medications before your next appointment, please call your pharmacy*  Lab Work: None  Testing/Procedures: None  Follow-Up: At [facility], you and your health needs are our priority.  As part of our continuing mission to provide you with exceptional heart care, we have created designated Provider Care Teams.  These Care Teams include your primary Cardiologist (physician) and Advanced Practice Providers (APPs -  Physician Assistants and Nurse Practitioners) who all work together to provide you with the care you need, when you need it.  Your next appointment:   1 month(s)  The format for your next appointment:   Virtual Visit   Provider:   Buford Dresser, MD     Signed, Buford Dresser, MD  01/30/2019 5:19 PM    [place]

## 2019-02-17 ENCOUNTER — Encounter: Payer: Self-pay | Admitting: Cardiology

## 2019-02-17 DIAGNOSIS — R0989 Other specified symptoms and signs involving the circulatory and respiratory systems: Secondary | ICD-10-CM | POA: Insufficient documentation

## 2019-02-17 DIAGNOSIS — Z7901 Long term (current) use of anticoagulants: Secondary | ICD-10-CM | POA: Insufficient documentation

## 2019-02-17 DIAGNOSIS — I480 Paroxysmal atrial fibrillation: Secondary | ICD-10-CM | POA: Insufficient documentation

## 2019-02-17 DIAGNOSIS — I10 Essential (primary) hypertension: Secondary | ICD-10-CM | POA: Insufficient documentation

## 2019-03-06 ENCOUNTER — Encounter: Payer: Self-pay | Admitting: Cardiology

## 2019-03-06 ENCOUNTER — Telehealth (INDEPENDENT_AMBULATORY_CARE_PROVIDER_SITE_OTHER): Payer: Medicare Other | Admitting: Cardiology

## 2019-03-06 ENCOUNTER — Telehealth: Payer: Self-pay

## 2019-03-06 VITALS — BP 165/73 | Ht 67.0 in | Wt 196.0 lb

## 2019-03-06 DIAGNOSIS — Z7901 Long term (current) use of anticoagulants: Secondary | ICD-10-CM

## 2019-03-06 DIAGNOSIS — I10 Essential (primary) hypertension: Secondary | ICD-10-CM

## 2019-03-06 DIAGNOSIS — Z8616 Personal history of covid-19: Secondary | ICD-10-CM

## 2019-03-06 DIAGNOSIS — I480 Paroxysmal atrial fibrillation: Secondary | ICD-10-CM | POA: Diagnosis not present

## 2019-03-06 NOTE — Telephone Encounter (Signed)

## 2019-03-06 NOTE — Patient Instructions (Signed)

## 2019-03-06 NOTE — Progress Notes (Signed)
Virtual Visit via Video Note   This visit type was conducted due to national recommendations for restrictions regarding the COVID-19 Pandemic (e.g. social distancing) in an effort to limit this patient's exposure and mitigate transmission in our community.  Due to her co-morbid illnesses, this patient is at least at moderate risk for complications without adequate follow up.  This format is felt to be most appropriate for this patient at this time.  All issues noted in this document were discussed and addressed.  A limited physical exam was performed with this format.  Please refer to the patient's chart for her consent to telehealth for [facility].   Date:  03/06/2019   ID:  Rebecca Houston, DOB [birth year], MRN [id number]  Patient Location: Home Provider Location: Office  PCP:  Rebecca Kaufmann, MD  Cardiologist:  Rebecca Dresser, MD  Electrophysiologist:  None   Evaluation Performed:  Follow-Up Visit  Chief Complaint:  Follow up  History of Present Illness:    Rebecca Houston is a 74 y.o. female with hypertension, atrial fibrillation, hyperlipidemia, history of syncope.  Today: In leg pain today, which is why she thinks her blood pressure was elevated. Has a log of recent daily blood pressures, most recent listed below:  122/75 156/100 126/82 141/68 111/61 124/69  Hr 50-70s with all readings. She listed more reading, the majority in the 120s/80s. Has not had any higher than today or lower than 0000000 systolic.  She and her husband both unfortunately had Covid and are recovering. She had a mild course. Her breathing overall was find, and her symptoms were more sore thraot/sinus type. Husband's covid more serious, went to [facility] for admission twice. It was during one of these that she hurt her leg sitting awkwardly on the bed, and she has had some pain since. No LE edema, redness, tightness, warmth. Is on blood thinner. Is at the site of a prior bruise from her syncope. She has  upcoming follow up with orthopedics for this.  Denies chest pain, shortness of breath at rest or with normal exertion. No PND, orthopnea, LE edema or unexpected weight gain. No syncope or palpitations.  Past Medical History:  Diagnosis Date  . A-fib (Rebecca Houston)   . Anxiety   . Arthritis   . Basal cell carcinoma   . Brain tumor (benign) ([place])   . Bruises easily   . Cancer ([place])    melanoma on neck  . Depression   . Heartburn   . History of kidney stones   . Hypertension   . ILD (interstitial lung disease) ([place]) 01/2018   Early  . Lumbar foraminal stenosis    L5-S1  . Melanoma ([place])   . PONV (postoperative nausea and vomiting)   . Psoriasis   . Pulmonary nodules    Stable bilateral sub-6 mm pulmonary nodules   . Renal cyst    Past Surgical History:  Procedure Laterality Date  . ABDOMINAL EXPOSURE N/A 04/02/2016   Procedure: ABDOMINAL EXPOSURE;  Surgeon: Rosetta Posner, MD;  Location: [place];  Service: Vascular;  Laterality: N/A;  . ABDOMINAL HYSTERECTOMY    . ANTERIOR CERVICAL DECOMP/DISCECTOMY FUSION N/A 04/16/2017   Procedure: Cervical Four-Five, Cervical Five-Six, Cervical Six-Seven  Anterior cervical decompression/discectomy, fusion;  Surgeon: Erline Levine, MD;  Location: [place];  Service: Neurosurgery;  Laterality: N/A;  C4-5 C5-6 C6-7 Anterior cervical decompression/discectomy, fusion  . ANTERIOR LUMBAR FUSION N/A 04/02/2016   Procedure: Lumbar five-Sacral one  Anterior lumbar interbody fusion with Dr. Curt Jews;  Surgeon: Erline Levine, MD;  Location: [place];  Service: Neurosurgery;  Laterality: N/A;  L5-S1 Anterior lumbar interbody fusion with Dr. Sherren Mocha Early  . BRAIN SURGERY    . BREAST REDUCTION SURGERY    . CATARACT EXTRACTION, BILATERAL    . CHOLECYSTECTOMY N/A 09/04/2018   Procedure: LAPAROSCOPIC CHOLECYSTECTOMY WITH INTRAOPERATIVE CHOLANGIOGRAM;  Surgeon: Armandina Gemma, MD;  [facility];  Service: General;  Laterality: N/A;  . COLONOSCOPY    . GALLBLADDER SURGERY   0723/2020  . NECK SURGERY     bone spur     Current Meds  Medication Sig  . ALPRAZolam (XANAX) 0.5 MG tablet Take 0.5 mg by mouth 2 (two) times daily as needed for anxiety.  Marland Kitchen amLODipine (NORVASC) 5 MG tablet Take 5 mg by mouth 2 (two) times daily.  Marland Kitchen buPROPion (WELLBUTRIN XL) 150 MG 24 hr tablet Take 150 mg by mouth daily.  . busPIRone (BUSPAR) 15 MG tablet Take 15 mg by mouth 3 (three) times daily.  Marland Kitchen escitalopram (LEXAPRO) 20 MG tablet Take 20 mg by mouth every evening.  . hydrochlorothiazide (HYDRODIURIL) 25 MG tablet Take 25 mg by mouth daily.  Marland Kitchen lisinopril (PRINIVIL,ZESTRIL) 20 MG tablet Take 20 mg by mouth 2 (two) times daily.  . metoprolol tartrate (LOPRESSOR) 50 MG tablet Take 1 tablet (50 mg total) by mouth 2 (two) times daily.  Marland Kitchen omeprazole (PRILOSEC) 20 MG capsule Take 20 mg by mouth every other day.  Marland Kitchen Propylene Glycol (SYSTANE BALANCE) 0.6 % SOLN Place 1 drop into both eyes as needed (dry eyes).  . rivaroxaban (XARELTO) 20 MG TABS tablet Take 20 mg by mouth daily with supper.     Allergies:   No known allergies   Social History   Tobacco Use  . Smoking status: Former Smoker    Packs/day: 0.50    Years: 15.00    Pack years: 7.50    Types: Cigarettes    Quit date: 2014    Years since quitting: 7.0  . Smokeless tobacco: Never Used  Substance Use Topics  . Alcohol use: No  . Drug use: No     Family Hx: The patient's family history includes Breast cancer in her sister; Dementia in her mother; Heart disease in her father; Stroke in her mother.  ROS:   Please see the history of present illness.    All other systems reviewed and are negative.   Prior CV studies:   The following studies were reviewed today: Prior echo  Labs/Other Tests and Data Reviewed:    EKG:  No new today  Recent Labs: 09/03/2018: BUN 22; Creatinine, Ser 0.95; Platelets 193; Potassium 4.0; Sodium 140 09/04/2018: Hemoglobin 12.3   Recent Lipid Panel No results found for: CHOL, TRIG,  HDL, CHOLHDL, LDLCALC, LDLDIRECT  Wt Readings from Last 3 Encounters:  03/06/19 196 lb (88.9 kg)  01/30/19 195 lb (88.5 kg)  01/20/19 200 lb 6.4 oz (90.9 kg)     Objective:    Vital Signs:  BP (!) 165/73   Ht 5\' 7"  (1.702 m)   Wt 196 lb (88.9 kg)   BMI 30.70 kg/m    VITAL SIGNS:  reviewed GEN:  no acute distress EYES:  sclerae anicteric, EOMI - Extraocular Movements Intact RESPIRATORY:  normal respiratory effort, symmetric expansion CARDIOVASCULAR:  no visible JVD SKIN:  no rash, lesions or ulcers. MUSCULOSKELETAL:  no obvious deformities. NEURO:  alert and oriented x 3, no obvious focal deficit PSYCH:  normal affect  ASSESSMENT & PLAN:  Hypertension: much improved. Rare elevated numbers, today with leg pain -no changes to medications today -continue to check periodically at home -counseled on high/low numbers that she should call me for -counseled on red flag signs that need emergent medical attention  Atrial fib, paroxysmal, on rivaroxaban: no symptoms, no tachycardia, continue current medications  History of syncope: had prodrome. No recurrend  Personal history of covid-19 infection: recovering well.   Time:   Today, I have spent 24 minutes with the patient face to face with telehealth technology discussing the above problems.  Additional 10 minutes of chart review, documentation. Total time 34 minutes.  Patient Instructions  Medication Instructions:  Your Physician recommend you continue on your current medication as directed.    *If you need a refill on your cardiac medications before your next appointment, please call your pharmacy*  Lab Work: None  Testing/Procedures: None  Follow-Up: At [facility], you and your health needs are our priority.  As part of our continuing mission to provide you with exceptional heart care, we have created designated Provider Care Teams.  These Care Teams include your primary Cardiologist (physician) and Advanced  Practice Providers (APPs -  Physician Assistants and Nurse Practitioners) who all work together to provide you with the care you need, when you need it.  Your next appointment:   6 month(s)  The format for your next appointment:   In Person  Provider:   Buford Dresser, MD     Signed, Rebecca Dresser, MD  03/06/2019     [place]

## 2019-03-07 ENCOUNTER — Encounter: Payer: Self-pay | Admitting: Cardiology

## 2019-03-07 DIAGNOSIS — Z8616 Personal history of COVID-19: Secondary | ICD-10-CM | POA: Insufficient documentation

## 2019-04-10 ENCOUNTER — Ambulatory Visit: Payer: Medicare Other | Admitting: Cardiology

## 2019-07-08 ENCOUNTER — Telehealth: Payer: Self-pay | Admitting: Cardiology

## 2019-07-08 NOTE — Telephone Encounter (Signed)
New Message"     Pt just saw her primary doctor and found out that her BMP is 339. He said he was sending the results to Dr Kirk Ruths. Pt would like to talk to Dr Harrell Gave if possible, if not the nurse. She very concerned and alarmed by this.

## 2019-07-08 NOTE — Telephone Encounter (Signed)
The patient is calling in because she saw her PCP yesterday and had lab work done. She is very concerned because she was told her BMP was extremely elevated at 339. The patient was told to follow up with cardiology and the labs were faxed over. I was unable to locate the labs in the patients Care Everywhere tab. The patient is unsure exactly which part of her BMP was elevated. She only knows the elevated lab was 339. I let her know that I would inform Dr. Harrell Gave and her nurse so that they could be on the look out for the fax of lab work.

## 2019-07-09 NOTE — Telephone Encounter (Signed)
Did they mean BNP? I will watch for the labs. I am rounding in the hospital this week and not in the office. Is she feeling poorly? It would be helpful to know why the labs were drawn to interpret them. Thanks.

## 2019-07-09 NOTE — Telephone Encounter (Signed)
I spoke with patient. She reports lab work that was drawn was a BMP not BNP. She [facility] a NP [facility] for swelling in her legs.  Swelling is greater in right leg and goes up to knees.  Swelling is OK in the morning but develops as the day goes on. Patient is trying to keep legs elevated.  NP prescribed lasix 20 mg daily for 3 days.  Patient reports she has chronic shortness of breath and is being followed by pulmonary.  Shortness of breath has not worsened recently. She is contacting PCP office to have lab work resent.

## 2019-07-09 NOTE — Telephone Encounter (Signed)
Rebecca Houston is calling back to check on the status of Dr. Harrell Gave receiving her labs and looking over them to see what she advises due to trying to get an answer before the 3 day memorial weekend arrives. I advised the patient of Dr. Judeth Cornfield reply from this morning and Shayana stated she will call the office and have them send the results again with " ATTN: DR. Harrell Gave". Please advise.

## 2019-07-14 NOTE — Telephone Encounter (Signed)
Patient is calling to follow up in regards to whether or not our office has received and reviewed lab results from her PCP. Please call.

## 2019-07-14 NOTE — Telephone Encounter (Signed)
Spoke with pt and informed that nurse and MD have both been out of the office for the past week. Nurse will check for lab work upon return tomorrow. Pt verbalized understanding.

## 2019-07-15 NOTE — Telephone Encounter (Signed)
Spoke with pt and informed we have not received lab work from [employer] as of this morning. Pt state she is concerned about her elevated BNP and is scheduled to go out of town Sunday. Pt state her PCP prescribed lasix and the swelling in her legs has decreased. Pt voiced she is still experiencing a little SOB.  Nurse advised for pt to make an appointment for further evaluations. Appointment scheduled for tomorrow 6/3 at 2:20 pm with Dr. Harrell Gave. Pt agreeable with plan.

## 2019-07-16 ENCOUNTER — Other Ambulatory Visit: Payer: Self-pay

## 2019-07-16 ENCOUNTER — Ambulatory Visit (INDEPENDENT_AMBULATORY_CARE_PROVIDER_SITE_OTHER): Payer: Medicare Other | Admitting: Cardiology

## 2019-07-16 ENCOUNTER — Encounter: Payer: Self-pay | Admitting: Cardiology

## 2019-07-16 VITALS — BP 128/62 | HR 55 | Ht 62.0 in | Wt 203.0 lb

## 2019-07-16 DIAGNOSIS — R600 Localized edema: Secondary | ICD-10-CM

## 2019-07-16 DIAGNOSIS — R0602 Shortness of breath: Secondary | ICD-10-CM | POA: Diagnosis not present

## 2019-07-16 DIAGNOSIS — I480 Paroxysmal atrial fibrillation: Secondary | ICD-10-CM | POA: Diagnosis not present

## 2019-07-16 DIAGNOSIS — Z7189 Other specified counseling: Secondary | ICD-10-CM

## 2019-07-16 DIAGNOSIS — R5382 Chronic fatigue, unspecified: Secondary | ICD-10-CM

## 2019-07-16 MED ORDER — METOPROLOL TARTRATE 25 MG PO TABS: 25.0000 mg | ORAL_TABLET | Freq: Two times a day (BID) | ORAL | Status: DC

## 2019-07-16 NOTE — Patient Instructions (Addendum)
Medication Instructions:  You are currently taking metoprolol tartrate 50 mg twice a day. We are going to try cutting this in half, which is 25 mg twice a day. We will see if this helps your energy level. If your heart rate goes very fast, like >100 sitting still, call and let me know.  *If you need a refill on your cardiac medications before your next appointment, please call your pharmacy*   Lab Work: None   Testing/Procedures: None   Follow-Up: At [facility], you and your health needs are our priority.  As part of our continuing mission to provide you with exceptional heart care, we have created designated Provider Care Teams.  These Care Teams include your primary Cardiologist (physician) and Advanced Practice Providers (APPs -  Physician Assistants and Nurse Practitioners) who all work together to provide you with the care you need, when you need it.  We recommend signing up for the patient portal called "MyChart".  Sign up information is provided on this After Visit Summary.  MyChart is used to connect with patients for Virtual Visits (Telemedicine).  Patients are able to view lab/test results, encounter notes, upcoming appointments, etc.  Non-urgent messages can be sent to your provider as well.   To learn more about what you can do with MyChart, go to [email or web address].    Your next appointment:   4-6 week(s)  The format for your next appointment:   Either In Person or Virtual  Provider:   Buford Dresser, MD

## 2019-07-16 NOTE — Progress Notes (Signed)
Cardiology Office Note:    Date:  07/16/2019   ID:  Rebecca Houston, DOB 09-17-1945, MRN [id number]  PCP:  Josem Kaufmann, MD  Cardiologist:  Buford Dresser, MD  Referring MD: Josem Kaufmann, MD   CC: follow up  History of Present Illness:    Rebecca Houston is a 74 y.o. female with a hx of hypertension, atrial fibrillation, hyperlipidemia, history of syncope. She was initially seen 01/20/2019 as a new consult at the request of Josem Kaufmann, MD for the evaluation and management of atrial fibrillation, hypertension, hyperlipidemia.  Today: Copy of labs received from PCP. Noted increased leg swelling, went to PCP. Had BNP drawn at that time, along with CMP. Only BNP significantly outside normal range. BNP listed as 339 (ULN 135 on their test).  Overall worried that this is why she feels tired, short of breath for a long time. Told her symptoms might be COPD. Very concerned and worried.  We discussed BNP, symptoms, etc at length today. Avoids salt, weighs herself daily. Weights have been stable. Trying to lose weight with [employer], but hasn't lost yet.  No PND or orthopnea. Discussed elevation and compression.   Has been struggling with severe anxiety and depression, slowly improving. Was having near syncope with severe events.   She is tearful during our interview. She is frustrated that she can't keep up with her husband, who has had lung cancer and surgery previously. Has struggled with both her and her husband's health issues, both had Covid. Feels like she is falling apart. Feels like her depression and anxiety are controlling her life.   Seen at [facility], most recently 05/2019. 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  Taking 50 mg metoprolol twice a day. Pulse 55.   Working with PT to get her legs stronger.   Past Medical History:  Diagnosis Date  . A-fib ([place])   . Anxiety   . Arthritis   . Basal cell carcinoma   . Brain tumor (benign) ([place])   . Bruises easily   .  Cancer ([place])    melanoma on neck  . Depression   . Heartburn   . History of kidney stones   . Hypertension   . ILD (interstitial lung disease) ([place]) 01/2018   Early  . Lumbar foraminal stenosis    L5-S1  . Melanoma ([place])   . PONV (postoperative nausea and vomiting)   . Psoriasis   . Pulmonary nodules    Stable bilateral sub-6 mm pulmonary nodules   . Renal cyst     Past Surgical History:  Procedure Laterality Date  . ABDOMINAL EXPOSURE N/A 04/02/2016   Procedure: ABDOMINAL EXPOSURE;  Surgeon: Rosetta Posner, MD;  Location: [place];  Service: Vascular;  Laterality: N/A;  . ABDOMINAL HYSTERECTOMY    . ANTERIOR CERVICAL DECOMP/DISCECTOMY FUSION N/A 04/16/2017   Procedure: Cervical Four-Five, Cervical Five-Six, Cervical Six-Seven  Anterior cervical decompression/discectomy, fusion;  Surgeon: Erline Levine, MD;  Location: [place];  Service: Neurosurgery;  Laterality: N/A;  C4-5 C5-6 C6-7 Anterior cervical decompression/discectomy, fusion  . ANTERIOR LUMBAR FUSION N/A 04/02/2016   Procedure: Lumbar five-Sacral one  Anterior lumbar interbody fusion with Dr. Curt Jews;  Surgeon: Erline Levine, MD;  Location: [place];  Service: Neurosurgery;  Laterality: N/A;  L5-S1 Anterior lumbar interbody fusion with Dr. Sherren Mocha Early  . BRAIN SURGERY    . BREAST REDUCTION SURGERY    . CATARACT EXTRACTION, BILATERAL    . CHOLECYSTECTOMY N/A 09/04/2018   Procedure: LAPAROSCOPIC CHOLECYSTECTOMY  WITH INTRAOPERATIVE CHOLANGIOGRAM;  Surgeon: Armandina Gemma, MD;  [facility];  Service: General;  Laterality: N/A;  . COLONOSCOPY    . GALLBLADDER SURGERY  0723/2020  . NECK SURGERY     bone spur    Current Medications: Current Outpatient Medications on File Prior to Visit  Medication Sig  . ALPRAZolam (XANAX) 0.5 MG tablet Take 0.5 mg by mouth 2 (two) times daily as needed for anxiety.  Marland Kitchen amLODipine (NORVASC) 5 MG tablet Take 5 mg by mouth 2 (two) times daily.  Marland Kitchen buPROPion (WELLBUTRIN XL) 150 MG 24 hr tablet Take 150  mg by mouth daily.  . busPIRone (BUSPAR) 15 MG tablet Take 15 mg by mouth 3 (three) times daily.  Marland Kitchen escitalopram (LEXAPRO) 20 MG tablet Take 20 mg by mouth every evening.  . hydrochlorothiazide (HYDRODIURIL) 25 MG tablet Take 25 mg by mouth daily.  Marland Kitchen lisinopril (PRINIVIL,ZESTRIL) 20 MG tablet Take 20 mg by mouth 2 (two) times daily.  . metoprolol tartrate (LOPRESSOR) 50 MG tablet Take 1 tablet (50 mg total) by mouth 2 (two) times daily.  Marland Kitchen omeprazole (PRILOSEC) 20 MG capsule Take 20 mg by mouth every other day.  . rivaroxaban (XARELTO) 20 MG TABS tablet Take 20 mg by mouth daily with supper.   No current facility-administered medications on file prior to visit.     Allergies:   No known allergies   Social History   Tobacco Use  . Smoking status: Former Smoker    Packs/day: 0.50    Years: 15.00    Pack years: 7.50    Types: Cigarettes    Quit date: 2014    Years since quitting: 7.4  . Smokeless tobacco: Never Used  Substance Use Topics  . Alcohol use: No  . Drug use: No    Family History: family history includes Breast cancer in her sister; Dementia in her mother; Heart disease in her father; Stroke in her mother.  ROS:   Please see the history of present illness.  Additional pertinent ROS otherwise unremarkable.  EKGs/Labs/Other Studies Reviewed:    The following studies were reviewed today: Echo 7.24.20  1. The left ventricle has normal systolic function with an ejection fraction of 60-65%. The cavity size was normal. There is mild concentric left ventricular hypertrophy. Left ventricular diastolic Doppler parameters are indeterminate. Indeterminate due  to A. Fibrillation.  2. The right ventricle has normal systolic function. The cavity was normal. There is no increase in right ventricular wall thickness. Right ventricular systolic pressure is mildly elevated with an estimated pressure of 35.5 mmHg.  3. Trivial pericardial effusion is present.  4. The aorta is normal in  size and structure.  5. The aortic root is normal in size and structure.  EKG:  EKG is personally reviewed.  The ekg ordered 01/20/19 demonstrates sinus bradycardia with sinus arrhythmia, HR 57 bpm  Recent Labs: 09/03/2018: BUN 22; Creatinine, Ser 0.95; Platelets 193; Potassium 4.0; Sodium 140 09/04/2018: Hemoglobin 12.3  Recent Lipid Panel No results found for: CHOL, TRIG, HDL, CHOLHDL, VLDL, LDLCALC, LDLDIRECT  Physical Exam:    VS:  BP 128/62   Pulse (!) 55   Ht 5\' 2"  (1.575 m)   Wt 203 lb (92.1 kg)   SpO2 98%   BMI 37.13 kg/m     Wt Readings from Last 3 Encounters:  07/16/19 203 lb (92.1 kg)  03/06/19 196 lb (88.9 kg)  01/30/19 195 lb (88.5 kg)    GEN: Well nourished, well developed in no  acute distress HEENT: Normal, moist mucous membranes NECK: No JVD CARDIAC: regular rhythm, normal S1 and S2, no rubs or gallops. No murmur. VASCULAR: Radial and DP pulses 2+ bilaterally. No carotid bruits RESPIRATORY:  Clear to auscultation without rales, wheezing or rhonchi  ABDOMEN: Soft, non-tender, non-distended MUSCULOSKELETAL:  Ambulates independently SKIN: Warm and dry, trivial bilateral LE edema NEUROLOGIC:  Alert and oriented x 3. No focal neuro deficits noted. PSYCHIATRIC:  Normal affect   ASSESSMENT:    1. Bilateral leg edema   2. Shortness of breath   3. Chronic fatigue   4. Paroxysmal atrial fibrillation (HCC)   5. Cardiac risk counseling   6. Counseling on health promotion and disease prevention    PLAN:    Fatigue, shortness breath, leg swelling:  We discussed this at length today. Reviewed her BNP from PCP visit. Discussed potential causes at length -avoid salt, elevate legs, use compression stockings when able -monitor daily weights -we will try decreasing her metoprolol to see if this helps her symptoms -will follow up closely, consider echo if symptoms not improved  Paroxysmal atrial fibrillation: episodes tend to be related to  procedures/surgeries -CHA2DS2/VASC Stroke Risk Points= 3 -rate control with metoprolol -long term anticoagulation with rivaroxaban  Cardiac risk counseling and prevention recommendations: -recommend heart healthy/Mediterranean diet, with whole grains, fruits, vegetable, fish, lean meats, nuts, and olive oil. Limit salt. -recommend moderate walking, 3-5 times/week for 30-50 minutes each session. Aim for at least 150 minutes.week. Goal should be pace of 3 miles/hours, or walking 1.5 miles in 30 minutes -recommend avoidance of tobacco products. Avoid excess alcohol.  Plan for follow up: 4-6 weeks to monitor symptoms closely  Buford Dresser, MD, PhD [place]  [facility]   Medication Adjustments/Labs and Tests Ordered: Current medicines are reviewed at length with the patient today.  Concerns regarding medicines are outlined above.  No orders of the defined types were placed in this encounter.  Meds ordered this encounter  Medications  . metoprolol tartrate (LOPRESSOR) 25 MG tablet    Sig: Take 1 tablet (25 mg total) by mouth 2 (two) times daily.    Patient Instructions  Medication Instructions:  You are currently taking metoprolol tartrate 50 mg twice a day. We are going to try cutting this in half, which is 25 mg twice a day. We will see if this helps your energy level. If your heart rate goes very fast, like >100 sitting still, call and let me know.  *If you need a refill on your cardiac medications before your next appointment, please call your pharmacy*   Lab Work: None   Testing/Procedures: None   Follow-Up: At [facility], you and your health needs are our priority.  As part of our continuing mission to provide you with exceptional heart care, we have created designated Provider Care Teams.  These Care Teams include your primary Cardiologist (physician) and Advanced Practice Providers (APPs -  Physician Assistants and Nurse Practitioners) who all work  together to provide you with the care you need, when you need it.  We recommend signing up for the patient portal called "MyChart".  Sign up information is provided on this After Visit Summary.  MyChart is used to connect with patients for Virtual Visits (Telemedicine).  Patients are able to view lab/test results, encounter notes, upcoming appointments, etc.  Non-urgent messages can be sent to your provider as well.   To learn more about what you can do with MyChart, go to [email or web address].    Your  next appointment:   4-6 week(s)  The format for your next appointment:   Either In Person or Virtual  Provider:   Buford Dresser, MD      Signed, Buford Dresser, MD PhD 07/16/2019  [place]

## 2019-09-03 ENCOUNTER — Encounter: Payer: Self-pay | Admitting: Cardiology

## 2019-09-03 ENCOUNTER — Other Ambulatory Visit: Payer: Self-pay

## 2019-09-03 ENCOUNTER — Ambulatory Visit (INDEPENDENT_AMBULATORY_CARE_PROVIDER_SITE_OTHER): Payer: Medicare Other | Admitting: Cardiology

## 2019-09-03 VITALS — BP 130/68 | HR 48 | Ht 67.0 in | Wt 203.0 lb

## 2019-09-03 DIAGNOSIS — M7989 Other specified soft tissue disorders: Secondary | ICD-10-CM | POA: Diagnosis not present

## 2019-09-03 DIAGNOSIS — Z7901 Long term (current) use of anticoagulants: Secondary | ICD-10-CM

## 2019-09-03 DIAGNOSIS — I10 Essential (primary) hypertension: Secondary | ICD-10-CM | POA: Diagnosis not present

## 2019-09-03 DIAGNOSIS — Z7189 Other specified counseling: Secondary | ICD-10-CM

## 2019-09-03 DIAGNOSIS — I480 Paroxysmal atrial fibrillation: Secondary | ICD-10-CM | POA: Diagnosis not present

## 2019-09-03 NOTE — Progress Notes (Signed)
Cardiology Office Note:    Date:  09/03/2019   ID:  Rebecca Houston, DOB 25-Nov-1945, MRN [id number]  PCP:  Josem Kaufmann, MD  Cardiologist:  Buford Dresser, MD  Referring MD: Josem Kaufmann, MD   CC: follow up  History of Present Illness:    Rebecca Houston is a 74 y.o. female with a hx of hypertension, atrial fibrillation, hyperlipidemia, history of syncope. She was initially seen 01/20/2019 as a new consult at the request of Josem Kaufmann, MD for the evaluation and management of atrial fibrillation, hypertension, hyperlipidemia.  Today: Doing better today. Leg swelling has resolved. BP at home has been solid and stable. Weight is stable. Took lasix for the three days I recommended, didn't change swelling. Hasn't taken in at least two weeks.   Still feeling like her lower legs are weak. Had a leg infection after an injury, needed antibiotics for cellulitis, which has held up PT. Plans to restart next week.   Denies chest pain, shortness of breath at rest or with normal exertion. No PND, orthopnea, LE edema or unexpected weight gain. No new syncope or palpitations. Occasional dizzyness.  Past Medical History:  Diagnosis Date  . A-fib ([place])   . Anxiety   . Arthritis   . Basal cell carcinoma   . Brain tumor (benign) ([place])   . Bruises easily   . Cancer ([place])    melanoma on neck  . Depression   . Heartburn   . History of kidney stones   . Hypertension   . ILD (interstitial lung disease) ([place]) 01/2018   Early  . Lumbar foraminal stenosis    L5-S1  . Melanoma ([place])   . PONV (postoperative nausea and vomiting)   . Psoriasis   . Pulmonary nodules    Stable bilateral sub-6 mm pulmonary nodules   . Renal cyst     Past Surgical History:  Procedure Laterality Date  . ABDOMINAL EXPOSURE N/A 04/02/2016   Procedure: ABDOMINAL EXPOSURE;  Surgeon: Rosetta Posner, MD;  Location: [place];  Service: Vascular;  Laterality: N/A;  . ABDOMINAL HYSTERECTOMY    . ANTERIOR CERVICAL  DECOMP/DISCECTOMY FUSION N/A 04/16/2017   Procedure: Cervical Four-Five, Cervical Five-Six, Cervical Six-Seven  Anterior cervical decompression/discectomy, fusion;  Surgeon: Erline Levine, MD;  Location: [place];  Service: Neurosurgery;  Laterality: N/A;  C4-5 C5-6 C6-7 Anterior cervical decompression/discectomy, fusion  . ANTERIOR LUMBAR FUSION N/A 04/02/2016   Procedure: Lumbar five-Sacral one  Anterior lumbar interbody fusion with Dr. Curt Jews;  Surgeon: Erline Levine, MD;  Location: [place];  Service: Neurosurgery;  Laterality: N/A;  L5-S1 Anterior lumbar interbody fusion with Dr. Sherren Mocha Early  . BRAIN SURGERY    . BREAST REDUCTION SURGERY    . CATARACT EXTRACTION, BILATERAL    . CHOLECYSTECTOMY N/A 09/04/2018   Procedure: LAPAROSCOPIC CHOLECYSTECTOMY WITH INTRAOPERATIVE CHOLANGIOGRAM;  Surgeon: Armandina Gemma, MD;  [facility];  Service: General;  Laterality: N/A;  . COLONOSCOPY    . GALLBLADDER SURGERY  0723/2020  . NECK SURGERY     bone spur    Current Medications: Current Outpatient Medications on File Prior to Visit  Medication Sig  . ALPRAZolam (XANAX) 0.5 MG tablet Take 0.5 mg by mouth 2 (two) times daily as needed for anxiety.  Marland Kitchen amLODipine (NORVASC) 5 MG tablet Take 5 mg by mouth 2 (two) times daily.  Marland Kitchen buPROPion (WELLBUTRIN XL) 150 MG 24 hr tablet Take 150 mg by mouth daily.  . busPIRone (BUSPAR) 15 MG tablet Take  15 mg by mouth 3 (three) times daily.  Marland Kitchen escitalopram (LEXAPRO) 20 MG tablet Take 20 mg by mouth every evening.  . Furosemide (LASIX PO) Take 10 mg by mouth as needed.  Marland Kitchen lisinopril (PRINIVIL,ZESTRIL) 20 MG tablet Take 20 mg by mouth 2 (two) times daily.  . metoprolol tartrate (LOPRESSOR) 25 MG tablet Take 1 tablet (25 mg total) by mouth 2 (two) times daily.  Marland Kitchen omeprazole (PRILOSEC) 20 MG capsule Take 20 mg by mouth every other day.  . predniSONE (DELTASONE) 5 MG tablet Take 5 mg by mouth daily.  . rivaroxaban (XARELTO) 20 MG TABS tablet Take 20 mg by mouth daily with  supper.   No current facility-administered medications on file prior to visit.     Allergies:   No known allergies   Social History   Tobacco Use  . Smoking status: Former Smoker    Packs/day: 0.50    Years: 15.00    Pack years: 7.50    Types: Cigarettes    Quit date: 2014    Years since quitting: 7.5  . Smokeless tobacco: Never Used  Vaping Use  . Vaping Use: Never used  Substance Use Topics  . Alcohol use: No  . Drug use: No    Family History: family history includes Breast cancer in her sister; Dementia in her mother; Heart disease in her father; Stroke in her mother.  ROS:   Please see the history of present illness.  Additional pertinent ROS otherwise unremarkable.     EKGs/Labs/Other Studies Reviewed:    The following studies were reviewed today: Echo 7.24.20  1. The left ventricle has normal systolic function with an ejection fraction of 60-65%. The cavity size was normal. There is mild concentric left ventricular hypertrophy. Left ventricular diastolic Doppler parameters are indeterminate. Indeterminate due  to A. Fibrillation.  2. The right ventricle has normal systolic function. The cavity was normal. There is no increase in right ventricular wall thickness. Right ventricular systolic pressure is mildly elevated with an estimated pressure of 35.5 mmHg.  3. Trivial pericardial effusion is present.  4. The aorta is normal in size and structure.  5. The aortic root is normal in size and structure.  EKG:  EKG is personally reviewed.  The ekg ordered 01/20/19 demonstrates sinus bradycardia with sinus arrhythmia, HR 57 bpm  Recent Labs: 09/04/2018: Hemoglobin 12.3  Recent Lipid Panel No results found for: CHOL, TRIG, HDL, CHOLHDL, VLDL, LDLCALC, LDLDIRECT  Physical Exam:    VS:  BP 130/68   Pulse (!) 48   Ht 5\' 7"  (1.702 m)   Wt 203 lb (92.1 kg)   SpO2 95%   BMI 31.79 kg/m     Wt Readings from Last 3 Encounters:  09/03/19 203 lb (92.1 kg)  07/16/19 203  lb (92.1 kg)  03/06/19 196 lb (88.9 kg)    GEN: Well nourished, well developed in no acute distress HEENT: Normal, moist mucous membranes NECK: No JVD CARDIAC: regular rhythm, normal S1 and S2, no rubs or gallops. No murmur. VASCULAR: Radial and DP pulses 2+ bilaterally. No carotid bruits RESPIRATORY:  Clear to auscultation without rales, wheezing or rhonchi  ABDOMEN: Soft, non-tender, non-distended MUSCULOSKELETAL:  Ambulates independently SKIN: Warm and dry, no edema. There is prominent ankle bursa but no pedal edema NEUROLOGIC:  Alert and oriented x 3. No focal neuro deficits noted. PSYCHIATRIC:  Normal affect   ASSESSMENT:    1. Leg swelling   2. Essential hypertension   3. Paroxysmal atrial fibrillation (  [place])   4. Cardiac risk counseling   5. Long term current use of anticoagulant   6. Counseling on health promotion and disease prevention    PLAN:    Fatigue, shortness breath, leg swelling:  Improved since last visit. No edema, just prominent ankle bursa on exam -avoid salt, elevate legs, use compression stockings when able -monitor daily weights -lasix only as needed, would go by daily weights  Hypertension: -just at goal today -continue amlodipine (prefers to take 5 mg BID), see comment above re: edema -continue lisinopril, metoprolol  Paroxysmal atrial fibrillation: episodes tend to be related to procedures/surgeries -CHA2DS2/VAS Stroke Risk Points= 3 -sinus bradycardia on ECG today -rate control with metoprolol -long term anticoagulation with rivaroxaban  Cardiac risk counseling and prevention recommendations: -recommend heart healthy/Mediterranean diet, with whole grains, fruits, vegetable, fish, lean meats, nuts, and olive oil. Limit salt. -recommend moderate walking, 3-5 times/week for 30-50 minutes each session. Aim for at least 150 minutes.week. Goal should be pace of 3 miles/hours, or walking 1.5 miles in 30 minutes -recommend avoidance of tobacco  products. Avoid excess alcohol.  Plan for follow up: 6 mos or sooner as needed  Buford Dresser, MD, PhD [place]  [facility]   Medication Adjustments/Labs and Tests Ordered: Current medicines are reviewed at length with the patient today.  Concerns regarding medicines are outlined above.  No orders of the defined types were placed in this encounter.  No orders of the defined types were placed in this encounter.   Patient Instructions  Medication Instructions:  No changes  *If you need a refill on your cardiac medications before your next appointment, please call your pharmacy*   Lab Work: Not needed    Testing/Procedures: Not needed   Follow-Up: At [facility], you and your health needs are our priority.  As part of our continuing mission to provide you with exceptional heart care, we have created designated Provider Care Teams.  These Care Teams include your primary Cardiologist (physician) and Advanced Practice Providers (APPs -  Physician Assistants and Nurse Practitioners) who all work together to provide you with the care you need, when you need it.  We recommend signing up for the patient portal called "MyChart".  Sign up information is provided on this After Visit Summary.  MyChart is used to connect with patients for Virtual Visits (Telemedicine).  Patients are able to view lab/test results, encounter notes, upcoming appointments, etc.  Non-urgent messages can be sent to your provider as well.   To learn more about what you can do with MyChart, go to [email or web address].    Your next appointment:   6 month(s)  The format for your next appointment:   In Person  Provider:   Buford Dresser, MD   Other Instructions n/a   Signed, Buford Dresser, MD PhD 09/03/2019  [place]

## 2019-09-03 NOTE — Patient Instructions (Signed)
Medication Instructions:  No changes  *If you need a refill on your cardiac medications before your next appointment, please call your pharmacy*   Lab Work: Not needed    Testing/Procedures: Not needed   Follow-Up: At [facility], you and your health needs are our priority.  As part of our continuing mission to provide you with exceptional heart care, we have created designated Provider Care Teams.  These Care Teams include your primary Cardiologist (physician) and Advanced Practice Providers (APPs -  Physician Assistants and Nurse Practitioners) who all work together to provide you with the care you need, when you need it.  We recommend signing up for the patient portal called "MyChart".  Sign up information is provided on this After Visit Summary.  MyChart is used to connect with patients for Virtual Visits (Telemedicine).  Patients are able to view lab/test results, encounter notes, upcoming appointments, etc.  Non-urgent messages can be sent to your provider as well.   To learn more about what you can do with MyChart, go to [email or web address].    Your next appointment:   6 month(s)  The format for your next appointment:   In Person  Provider:   Buford Dresser, MD   Other Instructions n/a

## 2019-09-21 ENCOUNTER — Encounter: Payer: Self-pay | Admitting: Cardiology

## 2019-12-22 ENCOUNTER — Telehealth: Payer: Self-pay | Admitting: *Deleted

## 2019-12-22 NOTE — Telephone Encounter (Signed)
A message was left, re: her follow up visit. 

## 2019-12-27 ENCOUNTER — Other Ambulatory Visit: Payer: Self-pay | Admitting: Cardiology

## 2020-01-08 ENCOUNTER — Other Ambulatory Visit: Payer: Self-pay | Admitting: Cardiology

## 2020-01-11 ENCOUNTER — Other Ambulatory Visit: Payer: Self-pay

## 2020-01-12 MED ORDER — METOPROLOL TARTRATE 25 MG PO TABS: 25.0000 mg | ORAL_TABLET | Freq: Two times a day (BID) | ORAL | 7 refills | Status: DC

## 2020-01-21 ENCOUNTER — Other Ambulatory Visit: Payer: Self-pay | Admitting: Cardiology

## 2020-02-29 IMAGING — RF INTRAOPERATIVE CHOLANGIOGRAM
1 series · 4 of 4 positions shown · non-contrast
Comparison: None

CLINICAL DATA: Intraoperative cholangiogram during laparoscopic
cholecystectomy.

EXAM:
INTRAOPERATIVE CHOLANGIOGRAM
FLUOROSCOPY TIME:  14 seconds

[Series 1: run · 4 of 83 frames shown]
[frame 13/83]
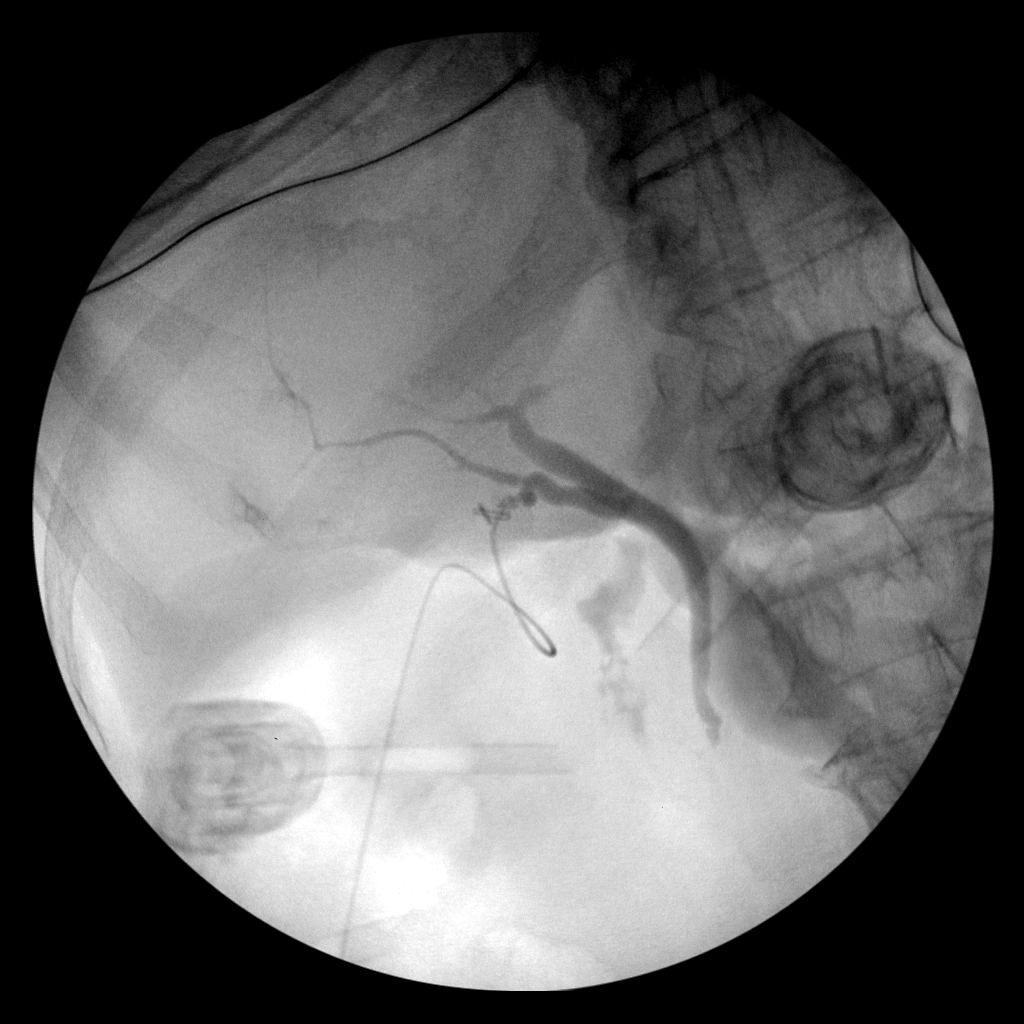
[frame 42/83]
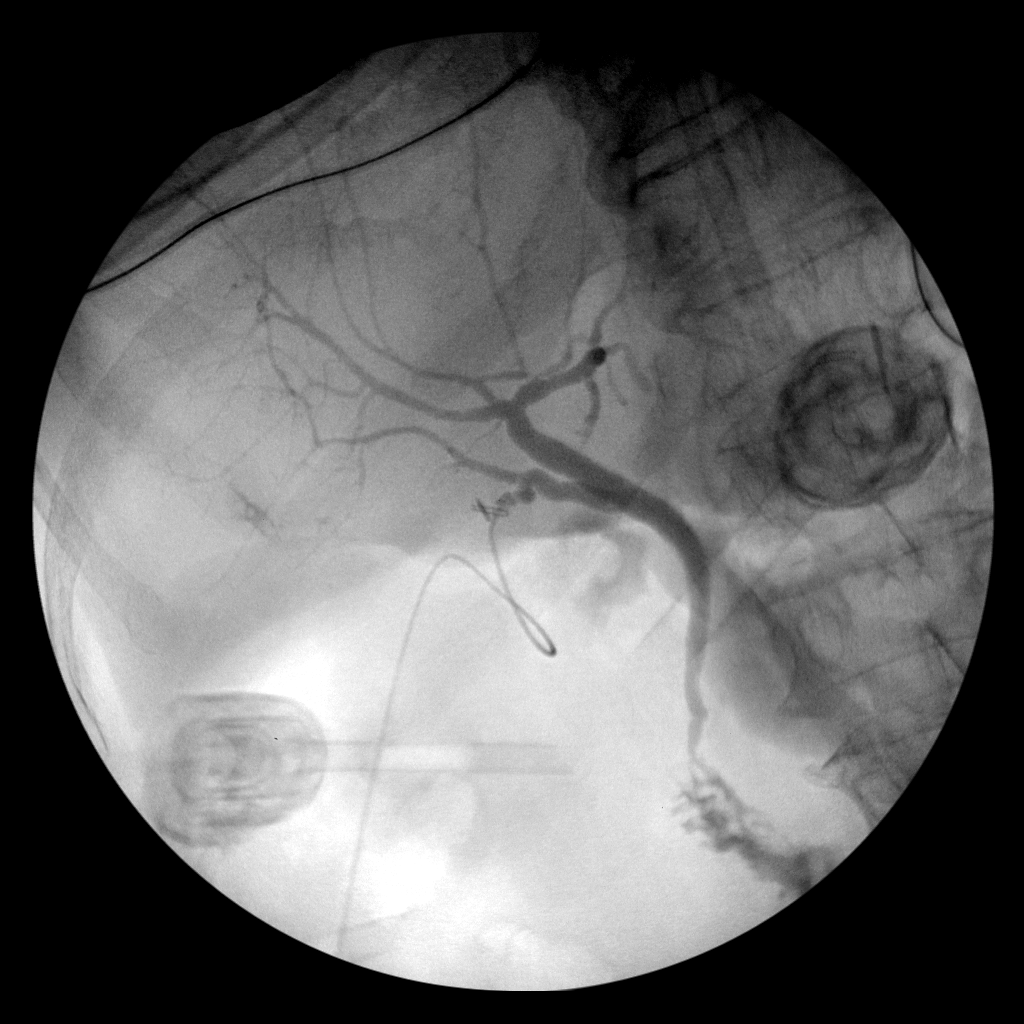
[frame 71/83]
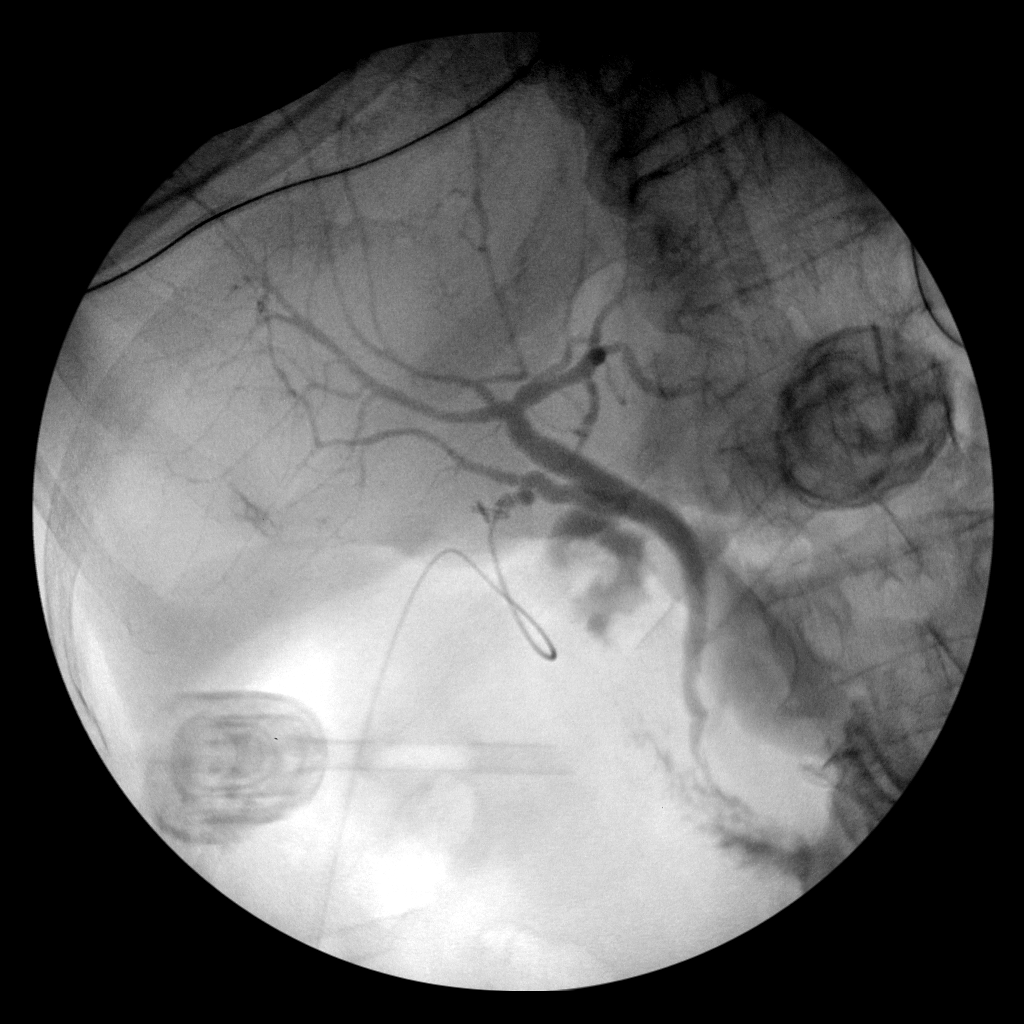
[frame 80/83]
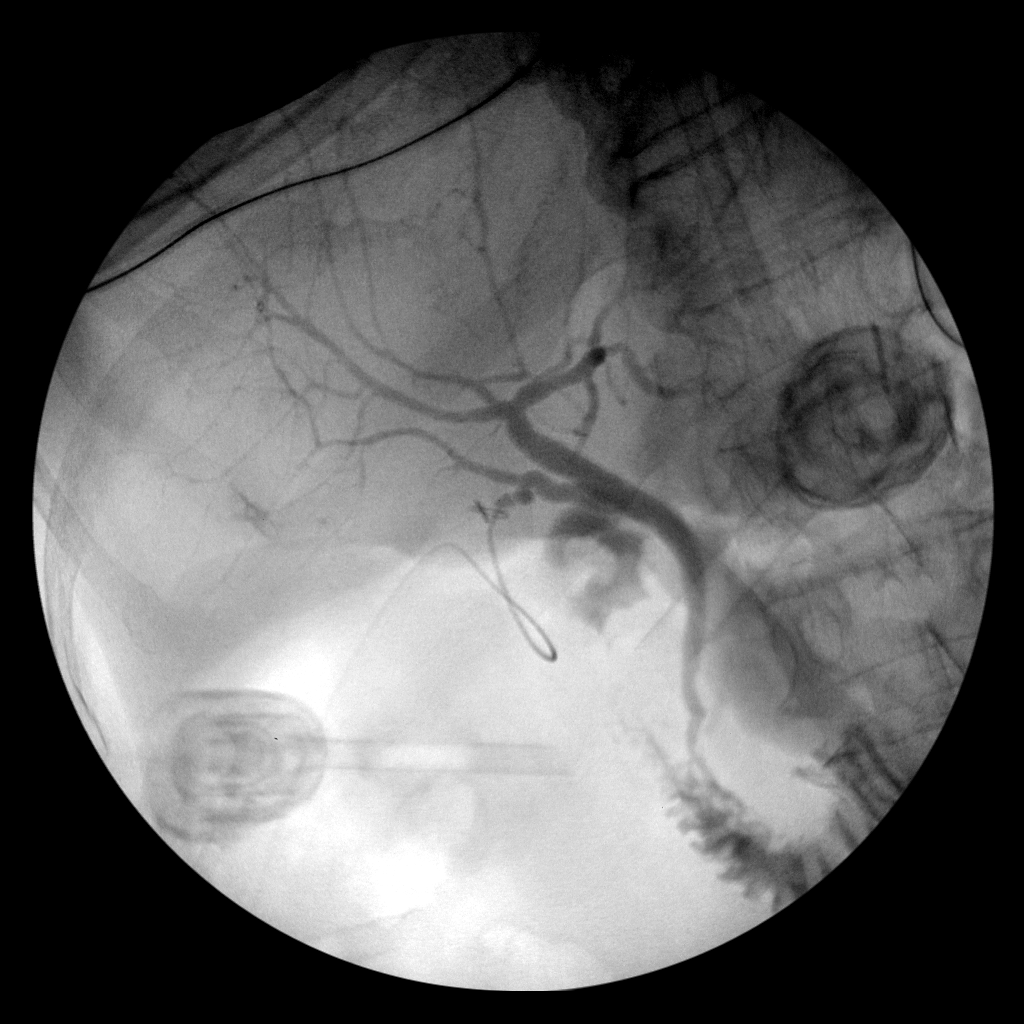

[4 of 4 positions shown; findings below may reference images not displayed]

FINDINGS: Intraoperative cholangiographic images of the right upper abdominal
quadrant during laparoscopic cholecystectomy are provided for
review.

Surgical clips overlie the expected location of the gallbladder
fossa.

Contrast injection demonstrates selective cannulation of the central
aspect of the cystic duct.

There is passage of contrast through the central aspect of the
cystic duct with filling of a non dilated common bile duct. There is
passage of contrast though the CBD and into the descending portion
of the duodenum.

There is minimal reflux of injected contrast into the common hepatic
duct and central aspect of the non dilated intrahepatic biliary
system.

There may be an anomalous insertion of a posterior division of the
right intrahepatic biliary tree either into the central aspect of
the cystic duct or immediately adjacent to the cystic duct.

There is minimal opacification of the central aspect of the
pancreatic duct appears nondilated.

There are no discrete filling defects within the opacified portions
of the biliary system to suggest the presence of
choledocholithiasis.
IMPRESSION: 1. No evidence of choledocholithiasis.
2. Potential anomalous insertion of a posterior division of the
right intrahepatic biliary tree either adjacent to the cystic duct
or draining directly into the central aspect of the cystic duct.

## 2020-03-02 ENCOUNTER — Ambulatory Visit: Payer: Medicare Other | Admitting: Cardiology

## 2020-03-16 ENCOUNTER — Other Ambulatory Visit: Payer: Self-pay

## 2020-03-16 ENCOUNTER — Encounter: Payer: Self-pay | Admitting: Cardiology

## 2020-03-16 ENCOUNTER — Ambulatory Visit (INDEPENDENT_AMBULATORY_CARE_PROVIDER_SITE_OTHER): Payer: Medicare Other | Admitting: Cardiology

## 2020-03-16 VITALS — BP 131/69 | HR 62 | Ht 67.0 in | Wt 206.4 lb

## 2020-03-16 DIAGNOSIS — R0989 Other specified symptoms and signs involving the circulatory and respiratory systems: Secondary | ICD-10-CM | POA: Diagnosis not present

## 2020-03-16 DIAGNOSIS — I10 Essential (primary) hypertension: Secondary | ICD-10-CM

## 2020-03-16 DIAGNOSIS — Z7901 Long term (current) use of anticoagulants: Secondary | ICD-10-CM

## 2020-03-16 DIAGNOSIS — I480 Paroxysmal atrial fibrillation: Secondary | ICD-10-CM

## 2020-03-16 DIAGNOSIS — Z7189 Other specified counseling: Secondary | ICD-10-CM

## 2020-03-16 NOTE — Progress Notes (Signed)
Cardiology Office Note:    Date:  03/16/2020   ID:  Rebecca Houston, DOB [birth year], MRN [id number]  PCP:  Josem Kaufmann, MD  Cardiologist:  Buford Dresser, MD  Referring MD: Josem Kaufmann, MD   CC: follow up  History of Present Illness:    Rebecca Houston is a 75 y.o. female with a hx of hypertension, atrial fibrillation, hyperlipidemia, history of syncope. She was initially seen 01/20/2019 as a new consult at the request of Josem Kaufmann, MD for the evaluation and management of atrial fibrillation, hypertension, hyperlipidemia.  Today: Doing better, coping well with stress, seeing a counselor. Coping with the loss of her sister last year. Husband in his 5th year of lung cancer and doing well, son's multiple myeloma under good control.   Hasn't felt any afib, though told by her PCP she was in afib at visit but she couldn't feel it.  Wonders if being off xarelto for 5 days in the past with her back surgery was the reason for her stroke last year at [facility]. I do not have records of this--I can see that she had lumbar spinal fusionin 03/2019 but I cannot access any records from her hospitalization. We discussed potential etiologies of this today. She has an upcoming appt with neurology (Dr. Leonie Man). Recommended she bring notes and scans with her to that visit. Discussed potential different etiologies of stroke, would be helpful to have more information and workup. She is not currently on aspirin or statin, on rivaroxaban.  Has struggled to lose weight. We discussed diet and exercise recommendations today. Reviewed medications as well.  Denies chest pain, shortness of breath at rest or with normal exertion. No PND, orthopnea, LE edema or unexpected weight gain. No syncope or palpitations.  Past Medical History:  Diagnosis Date  . A-fib ([place])   . Anxiety   . Arthritis   . Basal cell carcinoma   . Brain tumor (benign) ([place])   . Bruises easily   . Cancer ([place])    melanoma on neck  .  Depression   . Heartburn   . History of kidney stones   . Hypertension   . ILD (interstitial lung disease) ([place]) 01/2018   Early  . Lumbar foraminal stenosis    L5-S1  . Melanoma ([place])   . PONV (postoperative nausea and vomiting)   . Psoriasis   . Pulmonary nodules    Stable bilateral sub-6 mm pulmonary nodules   . Renal cyst     Past Surgical History:  Procedure Laterality Date  . ABDOMINAL EXPOSURE N/A 04/02/2016   Procedure: ABDOMINAL EXPOSURE;  Surgeon: Rosetta Posner, MD;  Location: [facility];  Service: Vascular;  Laterality: N/A;  . ABDOMINAL HYSTERECTOMY    . ANTERIOR CERVICAL DECOMP/DISCECTOMY FUSION N/A 04/16/2017   Procedure: Cervical Four-Five, Cervical Five-Six, Cervical Six-Seven  Anterior cervical decompression/discectomy, fusion;  Surgeon: Erline Levine, MD;  Location: [facility];  Service: Neurosurgery;  Laterality: N/A;  C4-5 C5-6 C6-7 Anterior cervical decompression/discectomy, fusion  . ANTERIOR LUMBAR FUSION N/A 04/02/2016   Procedure: Lumbar five-Sacral one  Anterior lumbar interbody fusion with Dr. Curt Jews;  Surgeon: Erline Levine, MD;  Location: [facility];  Service: Neurosurgery;  Laterality: N/A;  L5-S1 Anterior lumbar interbody fusion with Dr. Sherren Mocha Early  . BRAIN SURGERY    . BREAST REDUCTION SURGERY    . CATARACT EXTRACTION, BILATERAL    . CHOLECYSTECTOMY N/A 09/04/2018   Procedure: LAPAROSCOPIC CHOLECYSTECTOMY WITH INTRAOPERATIVE CHOLANGIOGRAM;  Surgeon: Armandina Gemma, MD;  Location: [facility];  Service: General;  Laterality: N/A;  . COLONOSCOPY    . GALLBLADDER SURGERY  0723/2020  . NECK SURGERY     bone spur    Current Medications: Current Outpatient Medications on File Prior to Visit  Medication Sig  . ALPRAZolam (XANAX) 0.5 MG tablet Take 0.5 mg by mouth 2 (two) times daily as needed for anxiety.  Marland Kitchen amLODipine (NORVASC) 5 MG tablet TAKE 1 TABLET BY MOUTH ONCE DAILY.  Marland Kitchen buPROPion (WELLBUTRIN XL) 150 MG 24 hr tablet Take 150 mg by mouth daily.  . busPIRone (BUSPAR) 15  MG tablet Take 15 mg by mouth 3 (three) times daily.  Marland Kitchen escitalopram (LEXAPRO) 20 MG tablet Take 20 mg by mouth every evening.  . Furosemide (LASIX PO) Take 10 mg by mouth as needed.  Marland Kitchen lisinopril (PRINIVIL,ZESTRIL) 20 MG tablet Take 20 mg by mouth 2 (two) times daily.  . metoprolol tartrate (LOPRESSOR) 25 MG tablet Take 1 tablet (25 mg total) by mouth 2 (two) times daily.  Marland Kitchen omeprazole (PRILOSEC) 20 MG capsule Take 20 mg by mouth every other day.  . predniSONE (DELTASONE) 5 MG tablet Take 5 mg by mouth daily.  . rivaroxaban (XARELTO) 20 MG TABS tablet Take 20 mg by mouth daily with supper.   No current facility-administered medications on file prior to visit.     Allergies:   No known allergies   Social History   Tobacco Use  . Smoking status: Former Smoker    Packs/day: 0.50    Years: 15.00    Pack years: 7.50    Types: Cigarettes    Quit date: 2014    Years since quitting: 8.0  . Smokeless tobacco: Never Used  Vaping Use  . Vaping Use: Never used  Substance Use Topics  . Alcohol use: No  . Drug use: No    Family History: family history includes Breast cancer in her sister; Dementia in her mother; Heart disease in her father; Stroke in her mother.  ROS:   Please see the history of present illness.  Additional pertinent ROS otherwise unremarkable.     EKGs/Labs/Other Studies Reviewed:    The following studies were reviewed today: Echo 7.24.20  1. The left ventricle has normal systolic function with an ejection fraction of 60-65%. The cavity size was normal. There is mild concentric left ventricular hypertrophy. Left ventricular diastolic Doppler parameters are indeterminate. Indeterminate due  to A. Fibrillation.  2. The right ventricle has normal systolic function. The cavity was normal. There is no increase in right ventricular wall thickness. Right ventricular systolic pressure is mildly elevated with an estimated pressure of 35.5 mmHg.  3. Trivial pericardial  effusion is present.  4. The aorta is normal in size and structure.  5. The aortic root is normal in size and structure.  EKG:  EKG is personally reviewed.  The ekg ordered 01/20/19 demonstrates sinus bradycardia with sinus arrhythmia, HR 57 bpm  Recent Labs: No results found for requested labs within last 8760 hours.  Recent Lipid Panel No results found for: CHOL, TRIG, HDL, CHOLHDL, VLDL, LDLCALC, LDLDIRECT  Physical Exam:    VS:  BP 131/69   Pulse 62   Ht $R'5\' 7"'JI$  (1.702 m)   Wt 206 lb 6.4 oz (93.6 kg)   SpO2 98%   BMI 32.33 kg/m     Wt Readings from Last 3 Encounters:  03/16/20 206 lb 6.4 oz (93.6 kg)  09/03/19 203 lb (92.1 kg)  07/16/19 203 lb (92.1  kg)    GEN: Well nourished, well developed in no acute distress HEENT: Normal, moist mucous membranes NECK: No JVD CARDIAC: regular rhythm, normal S1 and S2, no rubs or gallops. No murmur. VASCULAR: Radial and DP pulses 2+ bilaterally. No carotid bruits RESPIRATORY:  Clear to auscultation without rales, wheezing or rhonchi  ABDOMEN: Soft, non-tender, non-distended MUSCULOSKELETAL:  Ambulates independently SKIN: Warm and dry, no edema NEUROLOGIC:  Alert and oriented x 3. No focal neuro deficits noted. PSYCHIATRIC:  Normal affect   ASSESSMENT:    1. Paroxysmal atrial fibrillation (HCC)   2. Essential hypertension   3. Long term current use of anticoagulant   4. Suspected cerebrovascular accident (CVA)   5. Cardiac risk counseling   6. Counseling on health promotion and disease prevention    PLAN:    Reported history of CVA:  -she will get records of the events and bring to her appt with Dr. Leonie Man -I do not know what type of CVA she had. Denies any residual symptoms -she is on rivaroxaban but not aspirin or statin. Need additional information, but may need to start these. Will follow up records.  Hypertension: -at goal today -continue amlodipine 5 mg daily -continue lisinopril 20 mg BID (patient preference),  metoprolol tartrate 25 mg BID  Paroxysmal atrial fibrillation: episodes tend to be related to procedures/surgeries -CHA2DS2/VAS Stroke Risk Points= 3, though if she had a CVA this would raise her score -sinus on exam today -rate control with metoprolol -long term anticoagulation with rivaroxaban  Cardiac risk counseling and prevention recommendations: -recommend heart healthy/Mediterranean diet, with whole grains, fruits, vegetable, fish, lean meats, nuts, and olive oil. Limit salt. -recommend moderate walking, 3-5 times/week for 30-50 minutes each session. Aim for at least 150 minutes.week. Goal should be pace of 3 miles/hours, or walking 1.5 miles in 30 minutes -recommend avoidance of tobacco products. Avoid excess alcohol.  Plan for follow up: 6 mos or sooner as needed  Buford Dresser, MD, PhD [place]  [facility]   Medication Adjustments/Labs and Tests Ordered: Current medicines are reviewed at length with the patient today.  Concerns regarding medicines are outlined above.  No orders of the defined types were placed in this encounter.  No orders of the defined types were placed in this encounter.   Patient Instructions  Medication Instructions:  Your Physician recommend you continue on your current medication as directed.    *If you need a refill on your cardiac medications before your next appointment, please call your pharmacy*   Lab Work: If you have lab work done by primary care office, please have them fax results to [phone].   Testing/Procedures: None   Follow-Up: At [facility], you and your health needs are our priority.  As part of our continuing mission to provide you with exceptional heart care, we have created designated Provider Care Teams.  These Care Teams include your primary Cardiologist (physician) and Advanced Practice Providers (APPs -  Physician Assistants and Nurse Practitioners) who all work together to provide you with the  care you need, when you need it.  We recommend signing up for the patient portal called "MyChart".  Sign up information is provided on this After Visit Summary.  MyChart is used to connect with patients for Virtual Visits (Telemedicine).  Patients are able to view lab/test results, encounter notes, upcoming appointments, etc.  Non-urgent messages can be sent to your provider as well.   To learn more about what you can do with MyChart, go  to [email or web address].    Your next appointment:   6 month(s)  The format for your next appointment:   In Person  Provider:   Buford Dresser, MD       Signed, Buford Dresser, MD PhD 03/16/2020  [place]

## 2020-03-16 NOTE — Patient Instructions (Signed)
Medication Instructions:  Your Physician recommend you continue on your current medication as directed.    *If you need a refill on your cardiac medications before your next appointment, please call your pharmacy*   Lab Work: If you have lab work done by primary care office, please have them fax results to [phone].   Testing/Procedures: None   Follow-Up: At [facility], you and your health needs are our priority.  As part of our continuing mission to provide you with exceptional heart care, we have created designated Provider Care Teams.  These Care Teams include your primary Cardiologist (physician) and Advanced Practice Providers (APPs -  Physician Assistants and Nurse Practitioners) who all work together to provide you with the care you need, when you need it.  We recommend signing up for the patient portal called "MyChart".  Sign up information is provided on this After Visit Summary.  MyChart is used to connect with patients for Virtual Visits (Telemedicine).  Patients are able to view lab/test results, encounter notes, upcoming appointments, etc.  Non-urgent messages can be sent to your provider as well.   To learn more about what you can do with MyChart, go to [email or web address].    Your next appointment:   6 month(s)  The format for your next appointment:   In Person  Provider:   Buford Dresser, MD

## 2020-03-18 ENCOUNTER — Ambulatory Visit: Payer: Medicare Other | Admitting: Cardiology

## 2020-03-22 ENCOUNTER — Telehealth: Payer: Self-pay | Admitting: Neurology

## 2020-03-22 NOTE — Telephone Encounter (Signed)
Pt. states we need to request her medical records from [facility]. She states that's the only way the hospital will release them. She asks to be notified when we have received the records.  Fax # [phone]

## 2020-03-24 ENCOUNTER — Telehealth: Payer: Self-pay | Admitting: *Deleted

## 2020-03-24 NOTE — Telephone Encounter (Signed)
R/c medical records from [facility].notes in referral dept

## 2020-04-05 ENCOUNTER — Ambulatory Visit: Payer: Medicare Other | Admitting: Neurology

## 2020-04-06 ENCOUNTER — Telehealth: Payer: Self-pay | Admitting: Neurology

## 2020-04-06 ENCOUNTER — Ambulatory Visit: Payer: Medicare Other | Admitting: Neurology

## 2020-04-06 ENCOUNTER — Encounter: Payer: Self-pay | Admitting: Neurology

## 2020-04-06 ENCOUNTER — Ambulatory Visit (INDEPENDENT_AMBULATORY_CARE_PROVIDER_SITE_OTHER): Payer: Medicare Other | Admitting: Neurology

## 2020-04-06 VITALS — BP 137/66 | HR 61 | Ht 67.0 in | Wt 206.0 lb

## 2020-04-06 DIAGNOSIS — R413 Other amnesia: Secondary | ICD-10-CM | POA: Diagnosis not present

## 2020-04-06 DIAGNOSIS — G3184 Mild cognitive impairment, so stated: Secondary | ICD-10-CM | POA: Diagnosis not present

## 2020-04-06 DIAGNOSIS — I63412 Cerebral infarction due to embolism of left middle cerebral artery: Secondary | ICD-10-CM | POA: Diagnosis not present

## 2020-04-06 NOTE — Patient Instructions (Signed)
I had a long d/w patient  And her husband about her remote stroke,atrial fibrillation, memory loss, risk for recurrent stroke/TIAs, personally independently reviewed imaging studies and stroke evaluation results and answered questions.Continue Xarelto (rivaroxaban) daily  for secondary stroke prevention but take it consistently at the same time with a proper meal and maintain strict control of hypertension with blood pressure goal below 130/90, diabetes with hemoglobin A1c goal below 6.5% and lipids with LDL cholesterol goal below 70 mg/dL. I also advised the patient to eat a healthy diet with plenty of whole grains, cereals, fruits and vegetables, exercise regularly and maintain ideal body weight .I recommend we check lipid profile, hemoglobin A1c, MRI scan of the brain, MRA of the brain and neck and EEG.  I encourage her to increase participation in cognitively challenging activities like solving crossword puzzles, playing bridge and sodoku.  We also discussed memory compensation strategies.  Followup in the future with me in 3 months or call earlier if necessary. Memory Compensation Strategies  1. Use "WARM" strategy.  W= write it down  A= associate it  R= repeat it  M= make a mental note  2.   You can keep a Social worker.  Use a 3-ring notebook with sections for the following: calendar, important names and phone numbers,  medications, doctors' names/phone numbers, lists/reminders, and a section to journal what you did  each day.   3.    Use a calendar to write appointments down.  4.    Write yourself a schedule for the day.  This can be placed on the calendar or in a separate section of the Memory Notebook.  Keeping a  regular schedule can help memory.  5.    Use medication organizer with sections for each day or morning/evening pills.  You may need help loading it  6.    Keep a basket, or pegboard by the door.  Place items that you need to take out with you in the basket or on the  pegboard.  You may also want to  include a message board for reminders.  7.    Use sticky notes.  Place sticky notes with reminders in a place where the task is performed.  For example: " turn off the  stove" placed by the stove, "lock the door" placed on the door at eye level, " take your medications" on  the bathroom mirror or by the place where you normally take your medications.  8.    Use alarms/timers.  Use while cooking to remind yourself to check on food or as a reminder to take your medicine, or as a  reminder to make a call, or as a reminder to perform another task, etc.

## 2020-04-06 NOTE — Progress Notes (Signed)
[facility] [address] [place]. [place] [address] [phone]       OFFICE CONSULT NOTE  Ms. Rebecca Houston Date of Birth:  07/05/1945 Medical Record Number:  [id number]   Referring MD:   Dellia Nims, FNP Reason for Referral: Stroke  HPI: Rebecca Houston is a pleasant 75 year old Caucasian lady seen today for initial office consultation visit for stroke follow-up.  She is accompanied by her husband.  History is obtained from them and review of electronic medical records in [place].  She brought her imaging films on a disc which I personally reviewed.  She has past medical history of atrial fibrillation, anxiety, hypertension, degenerative spine disease s/p surgery and stable pulmonary nodules.  She states in a year ago in February 2021 she developed sudden onset of initially right arm and hand weakness followed subsequently by slurred speech and garbled speech and was admitted to [facility]  CT scan of the head was unremarkable and MRI scan showed a left insular and posterior frontal cortical embolic infarct.  This occurred the day after she had undergone back surgery by Dr. Vertell Limber at outpatient ambulatory surgical center.  She had been off Xarelto for 5 days.  Patient was not given TPA probably due to her recent surgery.  She is she states she did well and recovered quickly and had no residual deficits.  She had not had any neurological follow-up for her stroke and hence when she saw Dr. Vertell Limber recently suggested she establish herself with a neurologist.  She states she is not not noticed any speech difficulties or any hand weakness.  She was started on Crestor when she left the hospital but did not tolerate it due to side effects and stopped it.  She has not had any recent follow-up lipid profile checked.  Blood pressure is well controlled today it is 137/66.  She on inquiry admits to short-term memory difficulties which she has had for several years which  perhaps may be slightly worse.  She has trouble remembering recent information and is not very organized and does not write things down.  She does have remote history of meningioma surgery years ago but is unable to give me details about it.  She apparently had no residual deficits from that.  She has no new complaints today.  ROS:   14 system review of systems is positive for memory loss, bruising and all other systems negative PMH:  Past Medical History:  Diagnosis Date  . A-fib ([place])   . Anxiety   . Arthritis   . Basal cell carcinoma   . Brain tumor (benign) ([place])   . Bruises easily   . Cancer ([place])    melanoma on neck  . Depression   . Heartburn   . History of kidney stones   . Hypertension   . ILD (interstitial lung disease) ([place]) 01/2018   Early  . Lumbar foraminal stenosis    L5-S1  . Melanoma ([place])   . PONV (postoperative nausea and vomiting)   . Psoriasis   . Pulmonary nodules    Stable bilateral sub-6 mm pulmonary nodules   . Renal cyst     Social History:  Social History   Socioeconomic History  . Marital status: Married    Spouse name: Kayleen Memos  . Number of children: 3  . Years of education: Not on file  . Highest education level: Not on file  Occupational History  . Not on file  Tobacco Use  . Smoking  status: Former Smoker    Packs/day: 0.50    Years: 15.00    Pack years: 7.50    Types: Cigarettes    Quit date: 2014    Years since quitting: 8.1  . Smokeless tobacco: Never Used  Vaping Use  . Vaping Use: Never used  Substance and Sexual Activity  . Alcohol use: No  . Drug use: No  . Sexual activity: Not on file  Other Topics Concern  . Not on file  Social History Narrative   Lives with husband   Right Handed   Drinks no caffeine   Social Determinants of Health   Financial Resource Strain: Not on file  Food Insecurity: Not on file  Transportation Needs: Not on file  Physical Activity: Not on file  Stress: Not on file  Social  Connections: Not on file  Intimate Partner Violence: Not on file    Medications:   Current Outpatient Medications on File Prior to Visit  Medication Sig Dispense Refill  . ALPRAZolam (XANAX) 0.5 MG tablet Take 0.5 mg by mouth 2 (two) times daily as needed for anxiety.    Marland Kitchen amLODipine (NORVASC) 5 MG tablet TAKE 1 TABLET BY MOUTH ONCE DAILY. 30 tablet 11  . buPROPion (WELLBUTRIN XL) 150 MG 24 hr tablet Take 150 mg by mouth daily.    . busPIRone (BUSPAR) 15 MG tablet Take 15 mg by mouth 3 (three) times daily.    Marland Kitchen escitalopram (LEXAPRO) 20 MG tablet Take 20 mg by mouth every evening.    . Furosemide (LASIX PO) Take 10 mg by mouth as needed.    Marland Kitchen lisinopril (PRINIVIL,ZESTRIL) 20 MG tablet Take 20 mg by mouth 2 (two) times daily.    . metoprolol tartrate (LOPRESSOR) 25 MG tablet Take 1 tablet (25 mg total) by mouth 2 (two) times daily. 30 tablet 7  . omeprazole (PRILOSEC) 20 MG capsule Take 20 mg by mouth every other day.    . rivaroxaban (XARELTO) 20 MG TABS tablet Take 20 mg by mouth daily with supper.    . predniSONE (DELTASONE) 5 MG tablet Take 5 mg by mouth daily.     No current facility-administered medications on file prior to visit.    Allergies:   Allergies  Allergen Reactions  . No Known Allergies     Physical Exam General: well developed, well nourished elderly Caucasian lady, seated, in no evident distress Head: head normocephalic and atraumatic.   Neck: supple with no carotid or supraclavicular bruits Cardiovascular: regular rate and rhythm, no murmurs Musculoskeletal: no deformity Skin:  no rash/petichiae Vascular:  Normal pulses all extremities  Neurologic Exam Mental Status: Awake and fully alert. Oriented to place and time. Recent and remote memory intact. Attention span, concentration and fund of knowledge appropriate. Mood and affect appropriate.  Diminished recall 0/3.  Able to name only six animals which can walk on four legs.  Clock drawing 3/4. Cranial Nerves:  Fundoscopic exam reveals sharp disc margins. Pupils equal, briskly reactive to light. Extraocular movements full without nystagmus. Visual fields full to confrontation. Hearing intact. Facial sensation intact. Face, tongue, palate moves normally and symmetrically.  Motor: Normal bulk and tone. Normal strength in all tested extremity muscles. Sensory.: intact to touch , pinprick , position and vibratory sensation.  Coordination: Rapid alternating movements normal in all extremities. Finger-to-nose and heel-to-shin performed accurately bilaterally. Gait and Station: Arises from chair without difficulty. Stance is normal. Gait demonstrates normal stride length and balance . Able to heel, toe and tandem  walk with slight difficulty.  Reflexes: 1+ and symmetric. Toes downgoing.   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     PLAN: I had a long d/w patient  and her husband about her remote stroke,atrial fibrillation, memory loss, risk for recurrent stroke/TIAs, personally independently reviewed imaging studies and stroke evaluation results and answered questions.Continue Xarelto (rivaroxaban) daily  for secondary stroke prevention but take it consistently at the same time with a proper meal and maintain strict control of hypertension with blood pressure goal below 130/90, diabetes with hemoglobin A1c goal below 6.5% and lipids with LDL cholesterol goal below 70 mg/dL. I also advised the patient to eat a healthy diet with plenty of whole grains, cereals, fruits and vegetables, exercise regularly and maintain ideal body weight .I recommend we check lipid profile, hemoglobin A1c, MRI scan of the brain, MRA of the brain and neck and EEG.  I encourage  her to increase participation in cognitively challenging activities like solving crossword puzzles, playing bridge and sodoku.  We also discussed memory compensation strategies.  Followup in the future with me in 3 months or call earlier if necessary.  Greater than 50% time during this 45-minute consultation visit was spent in counseling and coordination of care about her stroke and memory loss and answering questions. Antony Contras, MD  [facility] [address] [place], [place] [address]  Phone [phone] Fax [phone] Note: This document was prepared with digital dictation and possible smart phrase technology. Any transcriptional errors that result from this process are unintentional.

## 2020-04-06 NOTE — Telephone Encounter (Signed)
Medicare/[employer] order sent to GI. No auth they will reach out to the patient to schedule.

## 2020-04-07 LAB — HEMOGLOBIN A1C
Est. average glucose Bld gHb Est-mCnc: 143 mg/dL
Hgb A1c MFr Bld: 6.6 % — ABNORMAL HIGH (ref 4.8–5.6)

## 2020-04-07 LAB — LIPID PANEL
Chol/HDL Ratio: 4.9 ratio — ABNORMAL HIGH (ref 0.0–4.4)
Cholesterol, Total: 162 mg/dL (ref 100–199)
HDL: 33 mg/dL — ABNORMAL LOW (ref >39)
LDL Chol Calc (NIH): 91 mg/dL (ref 0–99)
Triglycerides: 222 mg/dL — ABNORMAL HIGH (ref 0–149)
VLDL Cholesterol Cal: 38 mg/dL (ref 5–40)

## 2020-04-07 LAB — DEMENTIA PANEL
Homocysteine: 17.4 umol/L (ref 0.0–19.2)
RPR Ser Ql: NONREACTIVE
TSH: 1.06 u[IU]/mL (ref 0.450–4.500)
Vitamin B-12: 363 pg/mL (ref 232–1245)

## 2020-04-12 NOTE — Progress Notes (Signed)
Kindly inform the patient that lab work for reversible causes of memory loss was all satisfactory.  Cholesterol blood work and screening test for diabetes were both borderline.

## 2020-04-14 ENCOUNTER — Telehealth: Payer: Self-pay | Admitting: *Deleted

## 2020-04-14 NOTE — Telephone Encounter (Signed)
Spoke with patient and informed her that lab work for reversible causes of memory loss was all satisfactory. Cholesterol blood work and screening test for diabetes were both borderline, and her PCP can FU on those.. Patient verbalized understanding, appreciation.

## 2020-04-24 ENCOUNTER — Other Ambulatory Visit: Payer: Medicare Other

## 2020-05-02 ENCOUNTER — Other Ambulatory Visit: Payer: Self-pay

## 2020-05-02 ENCOUNTER — Ambulatory Visit (INDEPENDENT_AMBULATORY_CARE_PROVIDER_SITE_OTHER): Payer: Medicare Other | Admitting: Neurology

## 2020-05-02 DIAGNOSIS — R410 Disorientation, unspecified: Secondary | ICD-10-CM | POA: Diagnosis not present

## 2020-05-02 DIAGNOSIS — R413 Other amnesia: Secondary | ICD-10-CM

## 2020-05-13 ENCOUNTER — Other Ambulatory Visit: Payer: Medicare Other

## 2020-05-16 ENCOUNTER — Ambulatory Visit: Payer: Medicare Other | Admitting: Neurology

## 2020-05-23 NOTE — Progress Notes (Signed)
Kindly inform the patient that EEG study was normal and did not show any seizure activity.

## 2020-05-24 ENCOUNTER — Other Ambulatory Visit: Payer: Medicare Other

## 2020-05-26 ENCOUNTER — Telehealth: Payer: Self-pay | Admitting: *Deleted

## 2020-05-26 NOTE — Telephone Encounter (Signed)
Spoke with patient advised that EEG study was normal and did not show any seizure activity.  Patient verbalized understanding, appreciation.

## 2020-05-30 ENCOUNTER — Other Ambulatory Visit: Payer: Medicare Other

## 2020-05-30 ENCOUNTER — Ambulatory Visit
Admission: RE | Admit: 2020-05-30 | Discharge: 2020-05-30 | Disposition: A | Discharge status: Home / Self Care | Payer: Medicare Other | Source: Ambulatory Visit | Attending: Neurology | Admitting: Neurology

## 2020-05-30 ENCOUNTER — Other Ambulatory Visit: Payer: Self-pay

## 2020-05-30 DIAGNOSIS — I63412 Cerebral infarction due to embolism of left middle cerebral artery: Secondary | ICD-10-CM

## 2020-05-30 MED ORDER — GADOBENATE DIMEGLUMINE 529 MG/ML IV SOLN
19.0000 mL | Freq: Once | INTRAVENOUS | Status: AC | PRN
  Administered 2020-05-30: 19 mL via INTRAVENOUS

## 2020-06-15 NOTE — Progress Notes (Signed)
Kindly inform the patient that MRI scan of the brain shows no acute ,new or worrisome findings.  There is evidence of 2 small strokes in the deep portion of the brain on the left side as well as evidence of previous brain surgery and a very tiny benign tumor on the surface of the brain.  MR angiogram study of the brain and neck did not show any major blockages to worry about.

## 2020-06-16 ENCOUNTER — Telehealth: Payer: Self-pay | Admitting: Emergency Medicine

## 2020-06-16 NOTE — Telephone Encounter (Signed)
Called patient and discussed Dr. Clydene Fake review and findings of MRI.    Patient denied further questions, verbalized understanding and expressed appreciation for the phone call.

## 2020-06-16 NOTE — Telephone Encounter (Signed)
-----   Message from Garvin Fila, MD sent at 06/15/2020  6:58 AM EDT ----- Rebecca Houston inform the patient that MRI scan of the brain shows no acute ,new or worrisome findings.  There is evidence of 2 small strokes in the deep portion of the brain on the left side as well as evidence of previous brain surgery and a very tiny benign tumor on the surface of the brain.  MR angiogram study of the brain and neck did not show any major blockages to worry about.

## 2020-06-23 ENCOUNTER — Telehealth: Payer: Self-pay | Admitting: Neurology

## 2020-06-23 NOTE — Telephone Encounter (Signed)
Pt's daughter Amy Head on DPR called wanting the RN to call her with the MRI results due to the pt being confused with the results that were given to her. Please call daughter back at [phone].

## 2020-06-23 NOTE — Telephone Encounter (Signed)
error 

## 2020-06-23 NOTE — Telephone Encounter (Signed)
Discussed findings with patient's daughter of Dr. Leonie Man review of MRI brain and angiogram.    Amy (daughter) verbalized understanding of findings/results.    Patient denied further questions, verbalized understanding and expressed appreciation for the phone call.

## 2020-07-28 ENCOUNTER — Ambulatory Visit: Payer: Medicare Other | Admitting: Neurology

## 2020-11-16 ENCOUNTER — Ambulatory Visit: Payer: Medicare Other | Admitting: Neurology

## 2020-11-27 ENCOUNTER — Other Ambulatory Visit: Payer: Self-pay | Admitting: Cardiology

## 2020-12-06 ENCOUNTER — Other Ambulatory Visit: Payer: Self-pay

## 2020-12-06 ENCOUNTER — Ambulatory Visit (INDEPENDENT_AMBULATORY_CARE_PROVIDER_SITE_OTHER): Payer: Medicare Other | Admitting: Cardiology

## 2020-12-06 ENCOUNTER — Encounter (HOSPITAL_BASED_OUTPATIENT_CLINIC_OR_DEPARTMENT_OTHER): Payer: Self-pay | Admitting: Cardiology

## 2020-12-06 VITALS — BP 112/80 | HR 101 | Ht 67.0 in | Wt 208.2 lb

## 2020-12-06 DIAGNOSIS — Z8673 Personal history of transient ischemic attack (TIA), and cerebral infarction without residual deficits: Secondary | ICD-10-CM

## 2020-12-06 DIAGNOSIS — I10 Essential (primary) hypertension: Secondary | ICD-10-CM

## 2020-12-06 DIAGNOSIS — I63412 Cerebral infarction due to embolism of left middle cerebral artery: Secondary | ICD-10-CM

## 2020-12-06 DIAGNOSIS — Z7189 Other specified counseling: Secondary | ICD-10-CM

## 2020-12-06 DIAGNOSIS — R55 Syncope and collapse: Secondary | ICD-10-CM

## 2020-12-06 DIAGNOSIS — I480 Paroxysmal atrial fibrillation: Secondary | ICD-10-CM

## 2020-12-06 NOTE — Patient Instructions (Signed)
Medication Instructions:  Your Physician recommend you continue on your current medication as directed.    *If you need a refill on your cardiac medications before your next appointment, please call your pharmacy*   Lab Work: None today If you have labs (blood work) drawn today and your tests are completely normal, you will receive your results only by: [place] (if you have MyChart) OR A paper copy in the mail If you have any lab test that is abnormal or we need to change your treatment, we will call you to review the results.   Testing/Procedures: Dr. Harrell Gave would recommend getting a bilateral carotid arterial ultrasound with duplex with your primary care provider.   Follow-Up: At [facility], you and your health needs are our priority.  As part of our continuing mission to provide you with exceptional heart care, we have created designated Provider Care Teams.  These Care Teams include your primary Cardiologist (physician) and Advanced Practice Providers (APPs -  Physician Assistants and Nurse Practitioners) who all work together to provide you with the care you need, when you need it.  We recommend signing up for the patient portal called "MyChart".  Sign up information is provided on this After Visit Summary.  MyChart is used to connect with patients for Virtual Visits (Telemedicine).  Patients are able to view lab/test results, encounter notes, upcoming appointments, etc.  Non-urgent messages can be sent to your provider as well.   To learn more about what you can do with MyChart, go to [email or web address].    Your next appointment:   6 month(s)  The format for your next appointment:   In Person  Provider:   Buford Dresser, MD   Other Instructions None

## 2020-12-06 NOTE — Progress Notes (Signed)
Cardiology Office Note:    Date:  12/07/2020   ID:  Rebecca Houston, DOB 01/03/1946, MRN [id number]  PCP:  Josem Kaufmann, MD  Cardiologist:  Buford Dresser, MD  Referring MD: Josem Kaufmann, MD   CC: follow up  History of Present Illness:    Rebecca Houston is a 75 y.o. female with a hx of hypertension, atrial fibrillation, hyperlipidemia, history of syncope. She was initially seen 01/20/2019 as a new consult at the request of Josem Kaufmann, MD for the evaluation and management of atrial fibrillation, hypertension, hyperlipidemia.  Today: Overall she is feeling good aside from some stressful situations due to family issues.  Her main complaint today is that every now and then she suddenly feels like she will pass out. She would not describe this as feeling dizzy. Usually she is able to feel these episodes coming on, and she will try to steady herself until her symptoms resolve. Her episodes have a very quick duration. Her most recent episode was this morning, and her blood pressure was 157/76 at that time. She reports only having 2 episodes that resulted in full syncope. She agrees that dehydration may be a trigger.  Generally she is not able to tell if she is in atrial fibrillation, and has not felt any palpitations.  She denies any chest pain, or shortness of breath. No headaches, orthopnea, PND, lower extremity edema or exertional symptoms.  She follows up with her PCP in 2 weeks.  Past Medical History:  Diagnosis Date   A-fib [facility])    Anxiety    Arthritis    Basal cell carcinoma    Brain tumor (benign) (HCC)    Bruises easily    Cancer ([employer])    melanoma on neck   Depression    Heartburn    History of kidney stones    Hypertension    ILD (interstitial lung disease) ([place]) 01/2018   Early   Lumbar foraminal stenosis    L5-S1   Melanoma (HCC)    PONV (postoperative nausea and vomiting)    Psoriasis    Pulmonary nodules    Stable bilateral sub-6 mm pulmonary  nodules    Renal cyst     Past Surgical History:  Procedure Laterality Date   ABDOMINAL EXPOSURE N/A 04/02/2016   Procedure: ABDOMINAL EXPOSURE;  Surgeon: Rosetta Posner, MD;  Location: [place];  Service: Vascular;  Laterality: N/A;   ABDOMINAL HYSTERECTOMY     ANTERIOR CERVICAL DECOMP/DISCECTOMY FUSION N/A 04/16/2017   Procedure: Cervical Four-Five, Cervical Five-Six, Cervical Six-Seven  Anterior cervical decompression/discectomy, fusion;  Surgeon: Erline Levine, MD;  Location: [place];  Service: Neurosurgery;  Laterality: N/A;  C4-5 C5-6 C6-7 Anterior cervical decompression/discectomy, fusion   ANTERIOR LUMBAR FUSION N/A 04/02/2016   Procedure: Lumbar five-Sacral one  Anterior lumbar interbody fusion with Dr. Curt Jews;  Surgeon: Erline Levine, MD;  Location: [place];  Service: Neurosurgery;  Laterality: N/A;  L5-S1 Anterior lumbar interbody fusion with Dr. Sherren Mocha Early   [place]     CATARACT EXTRACTION, BILATERAL     CHOLECYSTECTOMY N/A 09/04/2018   Procedure: LAPAROSCOPIC CHOLECYSTECTOMY WITH INTRAOPERATIVE CHOLANGIOGRAM;  Surgeon: Armandina Gemma, MD;  Location: [facility];  Service: General;  Laterality: N/A;   COLONOSCOPY     GALLBLADDER SURGERY  0723/2020   NECK SURGERY     bone spur    Current Medications: Current Outpatient Medications on File Prior to Visit  Medication Sig   ALPRAZolam (  XANAX) 0.5 MG tablet Take 0.5 mg by mouth 2 (two) times daily as needed for anxiety.   amLODipine (NORVASC) 5 MG tablet TAKE 1 TABLET BY MOUTH ONCE DAILY.   buPROPion (WELLBUTRIN XL) 150 MG 24 hr tablet Take 150 mg by mouth daily.   busPIRone (BUSPAR) 15 MG tablet Take 15 mg by mouth 3 (three) times daily.   escitalopram (LEXAPRO) 20 MG tablet Take 20 mg by mouth every evening.   Furosemide (LASIX PO) Take 10 mg by mouth as needed.   lisinopril (PRINIVIL,ZESTRIL) 20 MG tablet Take 20 mg by mouth 2 (two) times daily.   metoprolol tartrate (LOPRESSOR) 25 MG tablet Take 1 tablet  (25 mg total) by mouth 2 (two) times daily.   omeprazole (PRILOSEC) 20 MG capsule Take 20 mg by mouth every other day.   rivaroxaban (XARELTO) 20 MG TABS tablet Take 20 mg by mouth daily with supper.   No current facility-administered medications on file prior to visit.     Allergies:   No known allergies   Social History   Tobacco Use   Smoking status: Former    Packs/day: 0.50    Years: 15.00    Pack years: 7.50    Types: Cigarettes    Quit date: 2014    Years since quitting: 8.8   Smokeless tobacco: Never  Vaping Use   Vaping Use: Never used  Substance Use Topics   Alcohol use: No   Drug use: No    Family History: family history includes Breast cancer in her sister; Dementia in her mother; Heart disease in her father; Stroke in her mother.  ROS:   Please see the history of present illness.   (+) Near-Syncope, Syncope (+) Stress (+) Arthralgias Additional pertinent ROS otherwise unremarkable.     EKGs/Labs/Other Studies Reviewed:    The following studies were reviewed today:  MRA Neck 05/30/2020: FINDINGS: This study is of adequate technical quality.  There is anterograde flow in the bilateral vertebral and carotid arteries on 2D-TOF views.     The flow signal of the left subclavian artery has no stenosis.  The left common carotid artery appears normal.  The proximal left internal carotid artery has 10 to 20% stenosis.  The left external carotid artery appears normal.  The left vertebral artery has no stenosis from its origin up to the vertebrobasilar junction.     On the right, the brachiocephalic trunk and subclavian arteries have no stenosis. The right common carotid artery appears normal.  The right internal carotid artery appears normal.  The right external carotid artery has 25-30% stenosis at the origin. The right vertebral artery has no stenosis from its origin to the vertebrobasilar junction. Limited views of the intracranial vasculature are unremarkable.      IMPRESSION: This MR angiogram of the neck arteries with and without contrast shows the following: 1.    10 to 20% stenosis of the proximal left internal carotid artery.  This is not hemodynamically significant. 2.    25 to 30% stenosis of the right external carotid artery at the origin.  This is not hemodynamically significant.  MRA Head 05/30/2020: FINDINGS: The imaged extracranial and intracranial portions of the internal carotid arteries appear normal. The middle cerebral and anterior cerebral arteries appear normal.   In the posterior circulation, the vertebral arteries are co-dominant. No stenosis is noted within the vertebral arteries and the basilar arteries.  There is a fetal origin of the right posterior cerebral artery deriving its flow  from the anterior circulation to the posterior communicating artery.  The left posterior cerebral artery derived the majority of the flow from the anterior circulation.   No aneurysms were identified.   IMPRESSION: This is a normal MR angiogram of the intracranial arteries.  Incidental note is made of a fetal origin of the right cerebral artery, normal variant.  CT Chest ([facility]) 03/02/2020:  Comparison Exams:  03/04/2019   Protocol:  Chest CT WO Protocol.  Contiguous 0.625 mm axial images with 5  mm axial, 3 mm coronal, and 3 mm sagittal reconstructions were obtained  from the neck base through the upper abdomen without IV contrast material.  In addition, 3-D maximal intensity projection (MIP) reconstructions were  performed to potentially increase the sensitivity for the detection of  pulmonary nodules.   Findings: As compared to the prior study there is interval decreased size  of mediastinal lymph nodes with a previously seen 12 mm right lower  paratracheal lymph node now measuring 10 mm which is at the upper limits of  normal. Other nodes are stable to slightly decreased in size. No evidence  of mediastinal, hilar, supraclavicular, or axillary  lymphadenopathy.   The central airways are patent. A few calcified and noncalcified pulmonary  nodules are identified. The largest noncalcified nodules in the right  middle lobe and measures approximately 5 mm is unchanged from the prior  study. No new nodules identified. Pleural spaces are normal.   Neck base is normal. The heart is mildly enlarged but similar to the prior  study. There are mild coronary artery atherosclerotic calcifications. No  pericardial effusion.   Evaluation of the upper abdomen is limited in the absence of IV contrast;  imaged portions are unremarkable.   Degenerative changes of the thoracic spine without suspicious lytic or  sclerotic osseous lesions.    Impression:   1. No evidence of lymphadenopathy. Previously seen mildly enlarged nodes  have normalized in size may be reactive.  2. Small pulmonary nodules are stable from the prior exam, most of which  are calcified indicative of prior granulomatous infection. The largest  noncalcified nodules 5 mm and is unchanged. No additional follow-up per  current Fleischner guidelines.    Echo 7.24.20  1. The left ventricle has normal systolic function with an ejection fraction of 60-65%. The cavity size was normal. There is mild concentric left ventricular hypertrophy. Left ventricular diastolic Doppler parameters are indeterminate. Indeterminate due  to A. Fibrillation.  2. The right ventricle has normal systolic function. The cavity was normal. There is no increase in right ventricular wall thickness. Right ventricular systolic pressure is mildly elevated with an estimated pressure of 35.5 mmHg.  3. Trivial pericardial effusion is present.  4. The aorta is normal in size and structure.  5. The aortic root is normal in size and structure.  EKG:  EKG is personally reviewed.   12/06/2020: atrial fibrillation at 101 bpm 01/20/19: sinus bradycardia with sinus arrhythmia, HR 57 bpm  Recent Labs: 04/06/2020: TSH  1.060  Recent Lipid Panel    Component Value Date/Time   CHOL 162 04/06/2020 1216   TRIG 222 (H) 04/06/2020 1216   HDL 33 (L) 04/06/2020 1216   CHOLHDL 4.9 (H) 04/06/2020 1216   LDLCALC 91 04/06/2020 1216    Physical Exam:    VS:  BP 112/80   Pulse (!) 101   Ht 5\' 7"  (1.702 m)   Wt 208 lb 3.2 oz (94.4 kg)   SpO2 97%   BMI 32.61  kg/m     Wt Readings from Last 3 Encounters:  12/06/20 208 lb 3.2 oz (94.4 kg)  04/06/20 206 lb (93.4 kg)  03/16/20 206 lb 6.4 oz (93.6 kg)    GEN: Well nourished, well developed in no acute distress HEENT: Normal, moist mucous membranes NECK: No JVD CARDIAC: irregularly irregular, normal S1 and S2, no rubs or gallops. No murmur. VASCULAR: Radial and DP pulses 2+ bilaterally. No carotid bruits RESPIRATORY:  Clear to auscultation without rales, wheezing or rhonchi  ABDOMEN: Soft, non-tender, non-distended MUSCULOSKELETAL:  Ambulates independently SKIN: Warm and dry, bursitic ankle swelling without edema NEUROLOGIC:  Alert and oriented x 3. No focal neuro deficits noted. PSYCHIATRIC:  Normal affect   ASSESSMENT:    1. Near syncope   2. Paroxysmal atrial fibrillation (HCC)   3. Syncope and collapse   4. History of CVA (cerebrovascular accident)   5. Essential hypertension   6. Cardiac risk counseling   7. Counseling on health promotion and disease prevention     PLAN:    Near syncope: -history of such, with rare full syncope -workup has been unremarkable from a cardiac standpoint. No palpitations prior to events, improve with sitting down -discussed hydration, lying down and elevating feet when she feels presyncopal  history of CVA:  -follows with Dr. Leonie Man -on rivaroxaban -LDL 91. I am not sure if prior stroke(s) were embolic, but recent imaging no hemodynamically significant stenosis (41-28% LICA, 78-67% RECA) -given history of CVA, I would recommend statin and goal LDL <70. She has been having memory issues. Continue to discuss  statins  Hypertension: -at goal today -continue amlodipine 5 mg daily -continue lisinopril 20 mg BID (patient preference), metoprolol tartrate 25 mg BID  Paroxysmal atrial fibrillation: episodes tend to be related to procedures/surgeries -CHA2DS2/VAS Stroke Risk Points= 5 -in afib today, rate improved by time of exam -rate control with metoprolol -long term anticoagulation with rivaroxaban -she is asymptomatic, discussed that if she notes symptoms or rapid rate to call and we can discuss cardioversion  Cardiac risk counseling and prevention recommendations: -recommend heart healthy/Mediterranean diet, with whole grains, fruits, vegetable, fish, lean meats, nuts, and olive oil. Limit salt. -recommend moderate walking, 3-5 times/week for 30-50 minutes each session. Aim for at least 150 minutes.week. Goal should be pace of 3 miles/hours, or walking 1.5 miles in 30 minutes -recommend avoidance of tobacco products. Avoid excess alcohol.  Plan for follow up: 6 mos or sooner as needed  Buford Dresser, MD, PhD [place]  [facility]   Medication Adjustments/Labs and Tests Ordered: Current medicines are reviewed at length with the patient today.  Concerns regarding medicines are outlined above.   Orders Placed This Encounter  Procedures   Electrocardiogram report    No orders of the defined types were placed in this encounter.   Patient Instructions  Medication Instructions:  Your Physician recommend you continue on your current medication as directed.    *If you need a refill on your cardiac medications before your next appointment, please call your pharmacy*   Lab Work: None today If you have labs (blood work) drawn today and your tests are completely normal, you will receive your results only by: [place] (if you have MyChart) OR A paper copy in the mail If you have any lab test that is abnormal or we need to change your treatment, we will call you to  review the results.   Testing/Procedures: Dr. Harrell Gave would recommend getting a bilateral carotid arterial ultrasound with duplex  with your primary care provider.   Follow-Up: At [facility], you and your health needs are our priority.  As part of our continuing mission to provide you with exceptional heart care, we have created designated Provider Care Teams.  These Care Teams include your primary Cardiologist (physician) and Advanced Practice Providers (APPs -  Physician Assistants and Nurse Practitioners) who all work together to provide you with the care you need, when you need it.  We recommend signing up for the patient portal called "MyChart".  Sign up information is provided on this After Visit Summary.  MyChart is used to connect with patients for Virtual Visits (Telemedicine).  Patients are able to view lab/test results, encounter notes, upcoming appointments, etc.  Non-urgent messages can be sent to your provider as well.   To learn more about what you can do with MyChart, go to [email or web address].    Your next appointment:   6 month(s)  The format for your next appointment:   In Person  Provider:   Buford Dresser, MD   Other Instructions None    I,Mathew Stumpf,acting as a scribe for Buford Dresser, MD.,have documented all relevant documentation on the behalf of Buford Dresser, MD,as directed by  Buford Dresser, MD while in the presence of Buford Dresser, MD.  I, Buford Dresser, MD, have reviewed all documentation for this visit. The documentation on 12/07/20 for the exam, diagnosis, procedures, and orders are all accurate and complete.   Signed, Buford Dresser, MD PhD 12/07/2020  [facility]

## 2020-12-07 ENCOUNTER — Encounter (HOSPITAL_BASED_OUTPATIENT_CLINIC_OR_DEPARTMENT_OTHER): Payer: Self-pay | Admitting: Cardiology

## 2020-12-07 DIAGNOSIS — Z8673 Personal history of transient ischemic attack (TIA), and cerebral infarction without residual deficits: Secondary | ICD-10-CM | POA: Insufficient documentation

## 2020-12-07 NOTE — Addendum Note (Signed)
Addended by: Gerald Stabs on: 12/07/2020 04:38 PM   Modules accepted: Orders

## 2020-12-27 ENCOUNTER — Telehealth (HOSPITAL_BASED_OUTPATIENT_CLINIC_OR_DEPARTMENT_OTHER): Payer: Self-pay | Admitting: Cardiology

## 2020-12-27 NOTE — Telephone Encounter (Signed)
Spoke with patient of Dr. Harrell Gave who reports recent dizzy spells She has had BP readings of 108/62 and 104/60 Her HR has been in the low-mid 50s (which she said is low for her) Her PCP suggested a decrease in the dose of metoprolol tartrate Advised will send message to MD/RN to review and advise

## 2020-12-27 NOTE — Telephone Encounter (Signed)
Pt c/o medication issue:  1. Name of Medication: metoprolol tartrate (LOPRESSOR) 25 MG tablet  2. How are you currently taking this medication (dosage and times per day)? Take 1 tablet (25 mg total) by mouth 2 (two) times daily.  3. Are you having a reaction (difficulty breathing--STAT)? mp  4. What is your medication issue? Patient went to her pcp dr and she told her to called to see if she can only take 1 pill a day to help with dizzy spell she is having. Please advise

## 2021-01-03 NOTE — Telephone Encounter (Signed)
Pt states that she has not heard back in regards to this issue and would like someone to follow up with her... please advise.

## 2021-01-03 NOTE — Telephone Encounter (Signed)
Patient calling back to find out whether she can decrease the metoprolol.

## 2021-01-03 NOTE — Telephone Encounter (Signed)
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 If she wanted to try a lower dose, she would have to take 1/2 pill twice a day. If she wants to try this lower dose, that is fine.

## 2021-01-03 NOTE — Telephone Encounter (Signed)
Spoke with patient who reports she has been taking Metoprolol succinate 50 mg once daily filled by her PCP.  Advised pt this office was unaware of the change in her medication from Metoprolol Tartrate 25 mg PO BID to Succinate 50 mg QD.  Advised she will need to follow up with PCP for medication adjustment as that office would know the reason it was changed.  She states understanding and will call back with any further questions or concerns.

## 2021-01-18 ENCOUNTER — Telehealth: Payer: Self-pay | Admitting: Cardiology

## 2021-01-18 NOTE — Telephone Encounter (Signed)
Pt c/o swelling: STAT is pt has developed SOB within 24 hours  If swelling, where is the swelling located? Legs and feet  How much weight have you gained and in what time span? 6-7lbs in about two month  Have you gained 3 pounds in a day or 5 pounds in a week? no  Do you have a log of your daily weights (if so, list)? no  Are you currently taking a fluid pill? yes  Are you currently SOB? no  Have you traveled recently? Has traveled by car for a couple of hours.

## 2021-01-18 NOTE — Telephone Encounter (Signed)
Spoke to pt. She report for the past month she's notice bilateral lower extremity edema. She state before the swelling would resolve with rest at night, but the last few weeks they are remaining swollen when she wake up in the morning. She denies any significant weight increase and but report she has gained roughly 6-7 lbs in a month. She also report she's notice some sob with exertion.    Appointment scheduled for 12/14 with Dr. Harrell Gave for further evaluations. Will forward to MD for any further recommendations.

## 2021-01-25 ENCOUNTER — Ambulatory Visit (HOSPITAL_BASED_OUTPATIENT_CLINIC_OR_DEPARTMENT_OTHER): Payer: Medicare Other | Admitting: Cardiology

## 2021-01-26 ENCOUNTER — Ambulatory Visit (HOSPITAL_BASED_OUTPATIENT_CLINIC_OR_DEPARTMENT_OTHER): Payer: Medicare Other | Admitting: Cardiology

## 2021-02-03 ENCOUNTER — Other Ambulatory Visit: Payer: Self-pay

## 2021-02-03 ENCOUNTER — Ambulatory Visit (INDEPENDENT_AMBULATORY_CARE_PROVIDER_SITE_OTHER): Payer: Medicare Other | Admitting: Cardiology

## 2021-02-03 VITALS — BP 140/70 | HR 61 | Ht 67.0 in | Wt 209.5 lb

## 2021-02-03 DIAGNOSIS — I63412 Cerebral infarction due to embolism of left middle cerebral artery: Secondary | ICD-10-CM

## 2021-02-03 DIAGNOSIS — R0609 Other forms of dyspnea: Secondary | ICD-10-CM

## 2021-02-03 DIAGNOSIS — R600 Localized edema: Secondary | ICD-10-CM | POA: Diagnosis not present

## 2021-02-03 DIAGNOSIS — I10 Essential (primary) hypertension: Secondary | ICD-10-CM

## 2021-02-03 DIAGNOSIS — Z8673 Personal history of transient ischemic attack (TIA), and cerebral infarction without residual deficits: Secondary | ICD-10-CM

## 2021-02-03 DIAGNOSIS — I480 Paroxysmal atrial fibrillation: Secondary | ICD-10-CM | POA: Diagnosis not present

## 2021-02-03 NOTE — Progress Notes (Signed)
Cardiology Office Note:    Date:  02/03/2021   ID:  Rebecca Houston, DOB 08/04/1945, MRN [id number]  PCP:  Josem Kaufmann, MD  Cardiologist:  Buford Dresser, MD  Referring MD: Josem Kaufmann, MD   CC: follow up  History of Present Illness:    Rebecca Houston is a 75 y.o. female with a hx of hypertension, atrial fibrillation, hyperlipidemia, history of syncope. She was initially seen 01/20/2019 as a new consult at the request of Josem Kaufmann, MD for the evaluation and management of atrial fibrillation, hypertension, hyperlipidemia.  Today: Has felt well since cutting back on the metoprolol. No syncope. Has significant LE edema. We discussed chronic venous insufficiency at length.  Also has shortness of breath with exertion, especially things like climbing stairs, etc. Has been going on for at least several months, gradual. Has hip issues, which have limited her with activity as well. We discussed increasing activity, what to watch for, when its safe to  keep going, etc.  Doesn't currently have a PCP, will in January.   Past Medical History:  Diagnosis Date   A-fib [facility])    Anxiety    Arthritis    Basal cell carcinoma    Brain tumor (benign) (HCC)    Bruises easily    Cancer ([place])    melanoma on neck   Depression    Heartburn    History of kidney stones    Hypertension    ILD (interstitial lung disease) ([place]) 01/2018   Early   Lumbar foraminal stenosis    L5-S1   Melanoma (HCC)    PONV (postoperative nausea and vomiting)    Psoriasis    Pulmonary nodules    Stable bilateral sub-6 mm pulmonary nodules    Renal cyst     Past Surgical History:  Procedure Laterality Date   ABDOMINAL EXPOSURE N/A 04/02/2016   Procedure: ABDOMINAL EXPOSURE;  Surgeon: Rosetta Posner, MD;  Location: [place];  Service: Vascular;  Laterality: N/A;   ABDOMINAL HYSTERECTOMY     ANTERIOR CERVICAL DECOMP/DISCECTOMY FUSION N/A 04/16/2017   Procedure: Cervical Four-Five, Cervical Five-Six, Cervical  Six-Seven  Anterior cervical decompression/discectomy, fusion;  Surgeon: Erline Levine, MD;  Location: [place];  Service: Neurosurgery;  Laterality: N/A;  C4-5 C5-6 C6-7 Anterior cervical decompression/discectomy, fusion   ANTERIOR LUMBAR FUSION N/A 04/02/2016   Procedure: Lumbar five-Sacral one  Anterior lumbar interbody fusion with Dr. Curt Jews;  Surgeon: Erline Levine, MD;  Location: [place];  Service: Neurosurgery;  Laterality: N/A;  L5-S1 Anterior lumbar interbody fusion with Dr. Sherren Mocha Early   [place]     CATARACT EXTRACTION, BILATERAL     CHOLECYSTECTOMY N/A 09/04/2018   Procedure: LAPAROSCOPIC CHOLECYSTECTOMY WITH INTRAOPERATIVE CHOLANGIOGRAM;  Surgeon: Armandina Gemma, MD;  Location: [facility];  Service: General;  Laterality: N/A;   COLONOSCOPY     GALLBLADDER SURGERY  0723/2020   NECK SURGERY     bone spur    Current Medications: Current Outpatient Medications on File Prior to Visit  Medication Sig   ALPRAZolam (XANAX) 0.5 MG tablet Take 0.5 mg by mouth 2 (two) times daily as needed for anxiety.   amLODipine (NORVASC) 5 MG tablet TAKE 1 TABLET BY MOUTH ONCE DAILY.   buPROPion (WELLBUTRIN XL) 150 MG 24 hr tablet Take 150 mg by mouth daily.   busPIRone (BUSPAR) 15 MG tablet Take 15 mg by mouth 3 (three) times daily.   escitalopram (LEXAPRO) 20 MG tablet Take  20 mg by mouth every evening.   Furosemide (LASIX PO) Take 10 mg by mouth as needed.   lisinopril (PRINIVIL,ZESTRIL) 20 MG tablet Take 20 mg by mouth 2 (two) times daily.   omeprazole (PRILOSEC) 20 MG capsule Take 20 mg by mouth every other day.   rivaroxaban (XARELTO) 20 MG TABS tablet Take 20 mg by mouth daily with supper.   metoprolol tartrate (LOPRESSOR) 25 MG tablet Take 1 tablet (25 mg total) by mouth 2 (two) times daily.   No current facility-administered medications on file prior to visit.     Allergies:   No known allergies   Social History   Tobacco Use   Smoking status: Former     Packs/day: 0.50    Years: 15.00    Pack years: 7.50    Types: Cigarettes    Quit date: 2014    Years since quitting: 8.9   Smokeless tobacco: Never  Vaping Use   Vaping Use: Never used  Substance Use Topics   Alcohol use: No   Drug use: No    Family History: family history includes Breast cancer in her sister; Dementia in her mother; Heart disease in her father; Stroke in her mother.  ROS:   Please see the history of present illness.   Additional pertinent ROS otherwise unremarkable.     EKGs/Labs/Other Studies Reviewed:    The following studies were reviewed today:  MRA Neck 05/30/2020: FINDINGS: This study is of adequate technical quality.  There is anterograde flow in the bilateral vertebral and carotid arteries on 2D-TOF views.     The flow signal of the left subclavian artery has no stenosis.  The left common carotid artery appears normal.  The proximal left internal carotid artery has 10 to 20% stenosis.  The left external carotid artery appears normal.  The left vertebral artery has no stenosis from its origin up to the vertebrobasilar junction.     On the right, the brachiocephalic trunk and subclavian arteries have no stenosis. The right common carotid artery appears normal.  The right internal carotid artery appears normal.  The right external carotid artery has 25-30% stenosis at the origin. The right vertebral artery has no stenosis from its origin to the vertebrobasilar junction. Limited views of the intracranial vasculature are unremarkable.     IMPRESSION: This MR angiogram of the neck arteries with and without contrast shows the following: 1.    10 to 20% stenosis of the proximal left internal carotid artery.  This is not hemodynamically significant. 2.    25 to 30% stenosis of the right external carotid artery at the origin.  This is not hemodynamically significant.  MRA Head 05/30/2020: FINDINGS: The imaged extracranial and intracranial portions of the internal  carotid arteries appear normal. The middle cerebral and anterior cerebral arteries appear normal.   In the posterior circulation, the vertebral arteries are co-dominant. No stenosis is noted within the vertebral arteries and the basilar arteries.  There is a fetal origin of the right posterior cerebral artery deriving its flow from the anterior circulation to the posterior communicating artery.  The left posterior cerebral artery derived the majority of the flow from the anterior circulation.   No aneurysms were identified.   IMPRESSION: This is a normal MR angiogram of the intracranial arteries.  Incidental note is made of a fetal origin of the right cerebral artery, normal variant.  CT Chest ([facility]) 03/02/2020:  Comparison Exams:  03/04/2019   Protocol:  Chest CT WO Protocol.  Contiguous 0.625 mm axial images with 5  mm axial, 3 mm coronal, and 3 mm sagittal reconstructions were obtained  from the neck base through the upper abdomen without IV contrast material.  In addition, 3-D maximal intensity projection (MIP) reconstructions were  performed to potentially increase the sensitivity for the detection of  pulmonary nodules.   Findings: As compared to the prior study there is interval decreased size  of mediastinal lymph nodes with a previously seen 12 mm right lower  paratracheal lymph node now measuring 10 mm which is at the upper limits of  normal. Other nodes are stable to slightly decreased in size. No evidence  of mediastinal, hilar, supraclavicular, or axillary lymphadenopathy.   The central airways are patent. A few calcified and noncalcified pulmonary  nodules are identified. The largest noncalcified nodules in the right  middle lobe and measures approximately 5 mm is unchanged from the prior  study. No new nodules identified. Pleural spaces are normal.   Neck base is normal. The heart is mildly enlarged but similar to the prior  study. There are mild coronary artery  atherosclerotic calcifications. No  pericardial effusion.   Evaluation of the upper abdomen is limited in the absence of IV contrast;  imaged portions are unremarkable.   Degenerative changes of the thoracic spine without suspicious lytic or  sclerotic osseous lesions.    Impression:   1. No evidence of lymphadenopathy. Previously seen mildly enlarged nodes  have normalized in size may be reactive.  2. Small pulmonary nodules are stable from the prior exam, most of which  are calcified indicative of prior granulomatous infection. The largest  noncalcified nodules 5 mm and is unchanged. No additional follow-up per  current Fleischner guidelines.    Echo 7.24.20  1. The left ventricle has normal systolic function with an ejection fraction of 60-65%. The cavity size was normal. There is mild concentric left ventricular hypertrophy. Left ventricular diastolic Doppler parameters are indeterminate. Indeterminate due  to A. Fibrillation.  2. The right ventricle has normal systolic function. The cavity was normal. There is no increase in right ventricular wall thickness. Right ventricular systolic pressure is mildly elevated with an estimated pressure of 35.5 mmHg.  3. Trivial pericardial effusion is present.  4. The aorta is normal in size and structure.  5. The aortic root is normal in size and structure.  EKG:  EKG is personally reviewed.   12/06/2020: atrial fibrillation at 101 bpm 01/20/19: sinus bradycardia with sinus arrhythmia, HR 57 bpm  Recent Labs: 04/06/2020: TSH 1.060  Recent Lipid Panel    Component Value Date/Time   CHOL 162 04/06/2020 1216   TRIG 222 (H) 04/06/2020 1216   HDL 33 (L) 04/06/2020 1216   CHOLHDL 4.9 (H) 04/06/2020 1216   LDLCALC 91 04/06/2020 1216    Physical Exam:    VS:  BP 140/70 (BP Location: Right Arm)    Pulse 61    Ht 5\' 7"  (1.702 m)    Wt 209 lb 8 oz (95 kg)    SpO2 97%    BMI 32.81 kg/m     Wt Readings from Last 3 Encounters:  02/03/21 209  lb 8 oz (95 kg)  12/06/20 208 lb 3.2 oz (94.4 kg)  04/06/20 206 lb (93.4 kg)    GEN: Well nourished, well developed in no acute distress HEENT: Normal, moist mucous membranes NECK: No JVD CARDIAC: regular rhythm, normal S1 and S2, no rubs or gallops. No murmur. VASCULAR: Radial and DP pulses  2+ bilaterally. No carotid bruits RESPIRATORY:  Clear to auscultation without rales, wheezing or rhonchi  ABDOMEN: Soft, non-tender, non-distended MUSCULOSKELETAL:  Ambulates independently SKIN: Warm and dry, no pitting edema, mild nonpitting edema with skin discoloration on bilateral anterior shins NEUROLOGIC:  Alert and oriented x 3. No focal neuro deficits noted. PSYCHIATRIC:  Normal affect    ASSESSMENT:    1. Dyspnea on exertion   2. Paroxysmal atrial fibrillation (HCC)   3. Bilateral leg edema   4. History of CVA (cerebrovascular accident)   5. Essential hypertension    PLAN:    Leg swelling/discoloration Dyspnea on exertion -improves with elevation, compression -most consistent with venous stasis, discussed at length today -has PRN furosemide for severe swelling -she has not had an echo since 2020, with worsening symptoms will rule out cardiac etiology with echo  Near syncope: -history of such, with rare full syncope -workup has been unremarkable from a cardiac standpoint. No palpitations prior to events, improve with sitting down -discussed hydration, lying down and elevating feet when she feels presyncopal  history of CVA:  -follows with Dr. Leonie Man -on rivaroxaban -LDL 91. I am not sure if prior stroke(s) were embolic, but recent imaging no hemodynamically significant stenosis (21-30% LICA, 86-57% RECA) -given history of CVA, I would recommend statin and goal LDL <70. She declines at this time  Hypertension: -above goal today but reports typically well controlled -continue amlodipine 5 mg daily -continue lisinopril 20 mg BID (patient preference), metoprolol tartrate 25 mg  BID  Paroxysmal atrial fibrillation: episodes tend to be related to procedures/surgeries -CHA2DS2/VAS Stroke Risk Points= 5 -rate control with metoprolol -long term anticoagulation with rivaroxaban  Cardiac risk counseling and prevention recommendations: -recommend heart healthy/Mediterranean diet, with whole grains, fruits, vegetable, fish, lean meats, nuts, and olive oil. Limit salt. -recommend moderate walking, 3-5 times/week for 30-50 minutes each session. Aim for at least 150 minutes.week. Goal should be pace of 3 miles/hours, or walking 1.5 miles in 30 minutes -recommend avoidance of tobacco products. Avoid excess alcohol.  Plan for follow up: 3 mos or sooner as needed  Buford Dresser, MD, PhD [place]   [facility]   Medication Adjustments/Labs and Tests Ordered: Current medicines are reviewed at length with the patient today.  Concerns regarding medicines are outlined above.   Orders Placed This Encounter  Procedures   ECHOCARDIOGRAM COMPLETE    No orders of the defined types were placed in this encounter.    Patient Instructions  Medication Instructions:  Your Physician recommend you continue on your current medication as directed.    *If you need a refill on your cardiac medications before your next appointment, please call your pharmacy*   Lab Work: None ordered today   Testing/Procedures: Your physician has requested that you have an echocardiogram. Echocardiography is a painless test that uses sound waves to create images of your heart. It provides your doctor with information about the size and shape of your heart and how well your hearts chambers and valves are working. This procedure takes approximately one hour. There are no restrictions for this procedure. [place], you and your health needs are our priority.  As part of our continuing mission to provide you with exceptional heart  care, we have created designated Provider Care Teams.  These Care Teams include your primary Cardiologist (physician) and Advanced Practice Providers (APPs -  Physician Assistants and Nurse Practitioners) who all work together to provide you  with the care you need, when you need it.  We recommend signing up for the patient portal called "MyChart".  Sign up information is provided on this After Visit Summary.  MyChart is used to connect with patients for Virtual Visits (Telemedicine).  Patients are able to view lab/test results, encounter notes, upcoming appointments, etc.  Non-urgent messages can be sent to your provider as well.   To learn more about what you can do with MyChart, go to [email or web address].    Your next appointment:   3 month(s)  The format for your next appointment:   In Person  Provider:   Buford Dresser, MD    Other Instructions None today    Signed, Buford Dresser, MD PhD 02/03/2021  [place]

## 2021-02-03 NOTE — Patient Instructions (Signed)
Medication Instructions:  Your Physician recommend you continue on your current medication as directed.    *If you need a refill on your cardiac medications before your next appointment, please call your pharmacy*   Lab Work: None ordered today   Testing/Procedures: Your physician has requested that you have an echocardiogram. Echocardiography is a painless test that uses sound waves to create images of your heart. It provides your doctor with information about the size and shape of your heart and how well your hearts chambers and valves are working. This procedure takes approximately one hour. There are no restrictions for this procedure. [place], you and your health needs are our priority.  As part of our continuing mission to provide you with exceptional heart care, we have created designated Provider Care Teams.  These Care Teams include your primary Cardiologist (physician) and Advanced Practice Providers (APPs -  Physician Assistants and Nurse Practitioners) who all work together to provide you with the care you need, when you need it.  We recommend signing up for the patient portal called "MyChart".  Sign up information is provided on this After Visit Summary.  MyChart is used to connect with patients for Virtual Visits (Telemedicine).  Patients are able to view lab/test results, encounter notes, upcoming appointments, etc.  Non-urgent messages can be sent to your provider as well.   To learn more about what you can do with MyChart, go to [email or web address].    Your next appointment:   3 month(s)  The format for your next appointment:   In Person  Provider:   Buford Dresser, MD    Other Instructions None today

## 2021-02-09 NOTE — Telephone Encounter (Signed)
Appointment completed 12/23

## 2021-02-16 ENCOUNTER — Encounter (HOSPITAL_BASED_OUTPATIENT_CLINIC_OR_DEPARTMENT_OTHER): Payer: Self-pay | Admitting: Cardiology

## 2021-02-23 ENCOUNTER — Other Ambulatory Visit: Payer: Self-pay

## 2021-02-23 ENCOUNTER — Ambulatory Visit (INDEPENDENT_AMBULATORY_CARE_PROVIDER_SITE_OTHER): Payer: Medicare Other

## 2021-02-23 DIAGNOSIS — R0609 Other forms of dyspnea: Secondary | ICD-10-CM | POA: Diagnosis not present

## 2021-02-23 LAB — ECHOCARDIOGRAM COMPLETE
AR max vel: 3.4 cm2
AV Area VTI: 2.96 cm2
AV Area mean vel: 3.08 cm2
AV Mean grad: 2 mmHg
AV Peak grad: 3.4 mmHg
Ao pk vel: 0.92 m/s
Area-P 1/2: 4.74 cm2
S' Lateral: 2.9 cm

## 2021-02-28 ENCOUNTER — Telehealth: Payer: Self-pay | Admitting: Cardiology

## 2021-02-28 NOTE — Telephone Encounter (Signed)
Patient is calling back to receive her results from Echo on Friday

## 2021-02-28 NOTE — Telephone Encounter (Signed)
Please result  

## 2021-03-01 NOTE — Telephone Encounter (Signed)
Pt of Dr Harrell Gave.  Pt will be contacted with result once she has reviewed echo.

## 2021-03-01 NOTE — Telephone Encounter (Signed)
Echo results not yet available, has been routed to Dr. Harrell Gave. Patient will be contacted when results are available!

## 2021-03-01 NOTE — Telephone Encounter (Signed)
Follow Up:     Patient calling to see if results are ready and to leave a different phone number.

## 2021-03-03 NOTE — Telephone Encounter (Signed)
See results note. 

## 2021-03-06 ENCOUNTER — Ambulatory Visit: Payer: Medicare Other | Admitting: Neurology

## 2021-03-07 ENCOUNTER — Telehealth: Payer: Self-pay | Admitting: Cardiology

## 2021-03-07 NOTE — Telephone Encounter (Signed)
Pt returning call for results... please advise  

## 2021-03-07 NOTE — Telephone Encounter (Signed)
Reviewed echocardiogram result with patient. She was reassured by result.  Loel Dubonnet, NP

## 2021-04-18 ENCOUNTER — Ambulatory Visit: Payer: Medicare Other | Admitting: Neurology

## 2021-05-04 NOTE — Progress Notes (Incomplete)
?Cardiology Office Note:   ? ?Date:  05/04/2021  ? ?ID:  Rebecca Houston, DOB 06-11-1945, MRN [id number] ? ?PCP:  Josem Kaufmann, MD  ?Cardiologist:  Buford Dresser, MD ? ?Referring MD: Josem Kaufmann, MD  ? ?CC: follow up ? ?History of Present Illness:   ? ?Rebecca Houston is a 76 y.o. female with a hx of hypertension, atrial fibrillation, hyperlipidemia, history of syncope. She was initially seen 01/20/2019 as a new consult at the request of Josem Kaufmann, MD for the evaluation and management of atrial fibrillation, hypertension, hyperlipidemia. ? ?Today: ?Has felt well since cutting back on the metoprolol. No syncope. Has significant LE edema. We discussed chronic venous insufficiency at length. ? ?Also has shortness of breath with exertion, especially things like climbing stairs, etc. Has been going on for at least several months, gradual. Has hip issues, which have limited her with activity as well. We discussed increasing activity, what to watch for, when its safe to  keep going, etc. ? ?Doesn't currently have a PCP, will in January.  ? ?Today: ?Overall, ? ?She denies any palpitations, chest pain, shortness of breath, or peripheral edema. No lightheadedness, headaches, syncope, orthopnea, or PND. ? ?(+) ? ?Past Medical History:  ?Diagnosis Date  ? A-fib ([place])   ? Anxiety   ? Arthritis   ? Basal cell carcinoma   ? Brain tumor (benign) ([place])   ? Bruises easily   ? Cancer [facility])   ? melanoma on neck  ? Depression   ? Heartburn   ? History of kidney stones   ? Hypertension   ? ILD (interstitial lung disease) ([place]) 01/2018  ? Early  ? Lumbar foraminal stenosis   ? L5-S1  ? Melanoma ([place])   ? PONV (postoperative nausea and vomiting)   ? Psoriasis   ? Pulmonary nodules   ? Stable bilateral sub-6 mm pulmonary nodules   ? Renal cyst   ? ? ?Past Surgical History:  ?Procedure Laterality Date  ? ABDOMINAL EXPOSURE N/A 04/02/2016  ? Procedure: ABDOMINAL EXPOSURE;  Surgeon: Rosetta Posner, MD;  Location: [place];  Service:  Vascular;  Laterality: N/A;  ? ABDOMINAL HYSTERECTOMY    ? ANTERIOR CERVICAL DECOMP/DISCECTOMY FUSION N/A 04/16/2017  ? Procedure: Cervical Four-Five, Cervical Five-Six, Cervical Six-Seven  Anterior cervical decompression/discectomy, fusion;  Surgeon: Erline Levine, MD;  Location: [place];  Service: Neurosurgery;  Laterality: N/A;  C4-5 C5-6 C6-7 Anterior cervical decompression/discectomy, fusion  ? ANTERIOR LUMBAR FUSION N/A 04/02/2016  ? Procedure: Lumbar five-Sacral one  Anterior lumbar interbody fusion with Dr. Curt Jews;  Surgeon: Erline Levine, MD;  Location: [place];  Service: Neurosurgery;  Laterality: N/A;  L5-S1 Anterior lumbar interbody fusion with Dr. Sherren Mocha Early  ? BRAIN SURGERY    ? BREAST REDUCTION SURGERY    ? CATARACT EXTRACTION, BILATERAL    ? CHOLECYSTECTOMY N/A 09/04/2018  ? Procedure: LAPAROSCOPIC CHOLECYSTECTOMY WITH INTRAOPERATIVE CHOLANGIOGRAM;  Surgeon: Armandina Gemma, MD;  Location: [facility];  Service: General;  Laterality: N/A;  ? COLONOSCOPY    ? GALLBLADDER SURGERY  0723/2020  ? NECK SURGERY    ? bone spur  ? ? ?Current Medications: ?Current Outpatient Medications on File Prior to Visit  ?Medication Sig  ? ALPRAZolam (XANAX) 0.5 MG tablet Take 0.5 mg by mouth 2 (two) times daily as needed for anxiety.  ? amLODipine (NORVASC) 5 MG tablet TAKE 1 TABLET BY MOUTH ONCE DAILY.  ? buPROPion (WELLBUTRIN XL) 150 MG 24 hr tablet Take 150 mg by mouth  daily.  ? busPIRone (BUSPAR) 15 MG tablet Take 15 mg by mouth 3 (three) times daily.  ? escitalopram (LEXAPRO) 20 MG tablet Take 20 mg by mouth every evening.  ? Furosemide (LASIX PO) Take 10 mg by mouth as needed.  ? lisinopril (PRINIVIL,ZESTRIL) 20 MG tablet Take 20 mg by mouth 2 (two) times daily.  ? metoprolol tartrate (LOPRESSOR) 25 MG tablet Take 1 tablet (25 mg total) by mouth 2 (two) times daily.  ? omeprazole (PRILOSEC) 20 MG capsule Take 20 mg by mouth every other day.  ? rivaroxaban (XARELTO) 20 MG TABS tablet Take 20 mg by mouth daily with supper.   ? ?No current facility-administered medications on file prior to visit.  ?  ? ?Allergies:   No known allergies  ? ?Social History  ? ?Tobacco Use  ? Smoking status: Former  ?  Packs/day: 0.50  ?  Years: 15.00  ?  Pack years: 7.50  ?  Types: Cigarettes  ?  Quit date: 2014  ?  Years since quitting: 9.2  ? Smokeless tobacco: Never  ?Vaping Use  ? Vaping Use: Never used  ?Substance Use Topics  ? Alcohol use: No  ? Drug use: No  ? ? ?Family History: ?family history includes Breast cancer in her sister; Dementia in her mother; Heart disease in her father; Stroke in her mother. ? ?ROS:   ?Please see the history of present illness.   ? ?Additional pertinent ROS otherwise unremarkable.   ? ? ?EKGs/Labs/Other Studies Reviewed:   ? ?The following studies were reviewed today: ? ?CT Chest ILD Follow-up ([facility]): ?Findings:  ? ?Lungs and pleura: There are scattered calcified and noncalcified pulmonary nodules. The largest is a 6 mm pulmonary nodule within right middle lobe, grossly similar in size to the prior exam. There are reticular opacities within the lingula and right middle lobe, which are new from prior exam. There also subpleural subsegmental consolidations within the left lower lobe which are new from prior exam. There is no pleural effusion. There is no pneumothorax.  ?Airway: The airways are patent.  ?Vasculature/Esophagus: The esophagus is unremarkable. The thoracic aorta is normal in caliber. The main pulmonary artery is normal in caliber.  ?Heart: The heart is normal size without pericardial effusion. There are  ?atherosclerotic coronary calcifications.  ?Lymph nodes: There is a right paratracheal lymph node measuring  ?approximately 11 mm which is grossly unchanged in size from the prior exam.  ?No significantly enlarged lymph nodes in the mediastinum, hilar and  ?axillary regions.  ?Thyroid: There is normal attenuation.  ?Bones:  The osseous structures are unremarkable. There are no lytic or  ?blastic lesions.  Patient is status post anterior fusion of cervical spine.  ?Visualized upper abdomen: Unremarkable  ?Soft tissues: Unremarkable.  ? ?Impression: Subsegmental consolidations within the left lower lobe with  ?reticular opacities within the lingula and right middle lobe, concerning  ?for infectious versus inflammatory etiology. Recommend continued follow-up to document resolution versus stability.  ? ?Echo 02/23/2021: ?Sonographer Comments: Suboptimal parasternal window and patient is  ?morbidly obese. Image acquisition challenging due to patient body habitus.  ?Global longitudinal strain was attempted.  ?IMPRESSIONS  ? ? 1. Left ventricular ejection fraction, by estimation, is 55 to 60%. The  ?left ventricle has normal function. The left ventricle has no regional  ?wall motion abnormalities. Left ventricular diastolic parameters are  ?indeterminate.  ? 2. Right ventricular systolic function is normal. The right ventricular  ?size is normal. There is normal pulmonary artery systolic  pressure. The  ?estimated right ventricular systolic pressure is 19.7 mmHg.  ? 3. Left atrial size was moderately dilated.  ? 4. Right atrial size was mildly dilated.  ? 5. The mitral valve is normal in structure. Mild mitral valve  ?regurgitation. No evidence of mitral stenosis.  ? 6. The aortic valve is normal in structure. Aortic valve regurgitation is  ?not visualized. No aortic stenosis is present.  ? 7. The inferior vena cava is normal in size with greater than 50%  ?respiratory variability, suggesting right atrial pressure of 3 mmHg.  ? ?Comparison(s): No significant change from prior study. Prior images  ?reviewed side by side. EF 60%, mild LVH, trivial PE.  ? ?MRA Neck 05/30/2020: ?FINDINGS: This study is of adequate technical quality.  There is anterograde flow in the bilateral vertebral and carotid arteries on 2D-TOF views.   ?  ?The flow signal of the left subclavian artery has no stenosis.  The left common carotid artery appears  normal.  The proximal left internal carotid artery has 10 to 20% stenosis.  The left external carotid artery appears normal.  The left vertebral artery has no stenosis from its origin up to the vertebrobasilar jun

## 2021-05-05 ENCOUNTER — Ambulatory Visit (HOSPITAL_BASED_OUTPATIENT_CLINIC_OR_DEPARTMENT_OTHER): Payer: Medicare Other | Admitting: Cardiology

## 2021-05-11 ENCOUNTER — Ambulatory Visit (HOSPITAL_BASED_OUTPATIENT_CLINIC_OR_DEPARTMENT_OTHER): Payer: Medicare Other | Admitting: Cardiology

## 2021-06-13 ENCOUNTER — Ambulatory Visit (INDEPENDENT_AMBULATORY_CARE_PROVIDER_SITE_OTHER): Payer: Medicare Other | Admitting: Cardiology

## 2021-06-13 ENCOUNTER — Encounter (HOSPITAL_BASED_OUTPATIENT_CLINIC_OR_DEPARTMENT_OTHER): Payer: Self-pay | Admitting: Cardiology

## 2021-06-13 VITALS — BP 124/72 | HR 84 | Ht 67.0 in | Wt 200.0 lb

## 2021-06-13 DIAGNOSIS — I872 Venous insufficiency (chronic) (peripheral): Secondary | ICD-10-CM

## 2021-06-13 DIAGNOSIS — I10 Essential (primary) hypertension: Secondary | ICD-10-CM

## 2021-06-13 DIAGNOSIS — Z7901 Long term (current) use of anticoagulants: Secondary | ICD-10-CM

## 2021-06-13 DIAGNOSIS — D6869 Other thrombophilia: Secondary | ICD-10-CM

## 2021-06-13 DIAGNOSIS — I4811 Longstanding persistent atrial fibrillation: Secondary | ICD-10-CM | POA: Diagnosis not present

## 2021-06-13 DIAGNOSIS — Z8673 Personal history of transient ischemic attack (TIA), and cerebral infarction without residual deficits: Secondary | ICD-10-CM | POA: Diagnosis not present

## 2021-06-13 NOTE — Patient Instructions (Signed)
Medication Instructions:  Your physician recommends that you continue on your current medications as directed. Please refer to the Current Medication list given to you today.  *If you need a refill on your cardiac medications before your next appointment, please call your pharmacy*  Lab Work: NONE  Testing/Procedures: NONE  Follow-Up: At [facility], you and your health needs are our priority.  As part of our continuing mission to provide you with exceptional heart care, we have created designated Provider Care Teams.  These Care Teams include your primary Cardiologist (physician) and Advanced Practice Providers (APPs -  Physician Assistants and Nurse Practitioners) who all work together to provide you with the care you need, when you need it.  We recommend signing up for the patient portal called "MyChart".  Sign up information is provided on this After Visit Summary.  MyChart is used to connect with patients for Virtual Visits (Telemedicine).  Patients are able to view lab/test results, encounter notes, upcoming appointments, etc.  Non-urgent messages can be sent to your provider as well.   To learn more about what you can do with MyChart, go to [email or web address].    Your next appointment:   6 month(s)  The format for your next appointment:   In Person  Provider:   Bridgette Christopher, MD     

## 2021-06-13 NOTE — Progress Notes (Signed)
?Cardiology Office Note:   ? ?Date:  06/13/2021  ? ?ID:  Rebecca Houston, DOB 25-Aug-1945, MRN [id number] ? ?PCP:  Josem Kaufmann, MD  ?Cardiologist:  Buford Dresser, MD ? ?Referring MD: Josem Kaufmann, MD  ? ?CC: follow up ? ?History of Present Illness:   ? ?Rebecca Houston is a 76 y.o. female with a hx of hypertension, atrial fibrillation, hyperlipidemia, history of syncope. She was initially seen 01/20/2019 as a new consult at the request of Josem Kaufmann, MD for the evaluation and management of atrial fibrillation, hypertension, hyperlipidemia. ? ?Today: ?Dealing with allergies. Son has multiple myeloma, has had pain issues.  ? ?Fewer dizzy spells with decreased metoprolol dose, no syncope.  ? ?Working on weight loss. Blood pressure has been well controlled. Tolerating well. Ankles have been much improved with compression stockings.  ? ?Walking somewhat limited by hip pain, chronic. Working on managing this. ? ?Denies chest pain, shortness of breath at rest or with normal exertion. No PND, orthopnea, or unexpected weight gain. No syncope or palpitations.  ? ?Past Medical History:  ?Diagnosis Date  ? A-fib ([place])   ? Anxiety   ? Arthritis   ? Basal cell carcinoma   ? Brain tumor (benign) ([place])   ? Bruises easily   ? Cancer [facility])   ? melanoma on neck  ? Depression   ? Heartburn   ? History of kidney stones   ? Hypertension   ? ILD (interstitial lung disease) ([place]) 01/2018  ? Early  ? Lumbar foraminal stenosis   ? L5-S1  ? Melanoma ([place])   ? PONV (postoperative nausea and vomiting)   ? Psoriasis   ? Pulmonary nodules   ? Stable bilateral sub-6 mm pulmonary nodules   ? Renal cyst   ? ? ?Past Surgical History:  ?Procedure Laterality Date  ? ABDOMINAL EXPOSURE N/A 04/02/2016  ? Procedure: ABDOMINAL EXPOSURE;  Surgeon: Rosetta Posner, MD;  Location: [place];  Service: Vascular;  Laterality: N/A;  ? ABDOMINAL HYSTERECTOMY    ? ANTERIOR CERVICAL DECOMP/DISCECTOMY FUSION N/A 04/16/2017  ? Procedure: Cervical Four-Five, Cervical  Five-Six, Cervical Six-Seven  Anterior cervical decompression/discectomy, fusion;  Surgeon: Erline Levine, MD;  Location: [place];  Service: Neurosurgery;  Laterality: N/A;  C4-5 C5-6 C6-7 Anterior cervical decompression/discectomy, fusion  ? ANTERIOR LUMBAR FUSION N/A 04/02/2016  ? Procedure: Lumbar five-Sacral one  Anterior lumbar interbody fusion with Dr. Curt Jews;  Surgeon: Erline Levine, MD;  Location: [place];  Service: Neurosurgery;  Laterality: N/A;  L5-S1 Anterior lumbar interbody fusion with Dr. Sherren Mocha Early  ? BRAIN SURGERY    ? BREAST REDUCTION SURGERY    ? CATARACT EXTRACTION, BILATERAL    ? CHOLECYSTECTOMY N/A 09/04/2018  ? Procedure: LAPAROSCOPIC CHOLECYSTECTOMY WITH INTRAOPERATIVE CHOLANGIOGRAM;  Surgeon: Armandina Gemma, MD;  [facility];  Service: General;  Laterality: N/A;  ? COLONOSCOPY    ? GALLBLADDER SURGERY  0723/2020  ? NECK SURGERY    ? bone spur  ? ? ?Current Medications: ?Current Outpatient Medications on File Prior to Visit  ?Medication Sig  ? ALPRAZolam (XANAX) 0.5 MG tablet Take 0.5 mg by mouth 2 (two) times daily as needed for anxiety.  ? amLODipine (NORVASC) 5 MG tablet TAKE 1 TABLET BY MOUTH ONCE DAILY.  ? buPROPion (WELLBUTRIN XL) 150 MG 24 hr tablet Take 150 mg by mouth daily.  ? busPIRone (BUSPAR) 15 MG tablet Take 15 mg by mouth 3 (three) times daily.  ? escitalopram (LEXAPRO) 20 MG tablet Take 20  mg by mouth every evening.  ? Furosemide (LASIX PO) Take 10 mg by mouth as needed.  ? lisinopril (PRINIVIL,ZESTRIL) 20 MG tablet Take 20 mg by mouth 2 (two) times daily.  ? metoprolol tartrate (LOPRESSOR) 25 MG tablet Take 1 tablet (25 mg total) by mouth 2 (two) times daily.  ? omeprazole (PRILOSEC) 20 MG capsule Take 20 mg by mouth every other day.  ? rivaroxaban (XARELTO) 20 MG TABS tablet Take 20 mg by mouth daily with supper.  ? ?No current facility-administered medications on file prior to visit.  ?  ? ?Allergies:   No known allergies  ? ?Social History  ? ?Tobacco Use  ? Smoking  status: Former  ?  Packs/day: 0.50  ?  Years: 15.00  ?  Pack years: 7.50  ?  Types: Cigarettes  ?  Quit date: 2014  ?  Years since quitting: 9.3  ? Smokeless tobacco: Never  ?Vaping Use  ? Vaping Use: Never used  ?Substance Use Topics  ? Alcohol use: No  ? Drug use: No  ? ? ?Family History: ?family history includes Breast cancer in her sister; Dementia in her mother; Heart disease in her father; Stroke in her mother. ? ?ROS:   ?Please see the history of present illness.   ?Additional pertinent ROS otherwise unremarkable.   ? ? ?EKGs/Labs/Other Studies Reviewed:   ? ?The following studies were reviewed today: ?Echo 02/23/21: ?1. Left ventricular ejection fraction, by estimation, is 55 to 60%. The  ?left ventricle has normal function. The left ventricle has no regional  ?wall motion abnormalities. Left ventricular diastolic parameters are  ?indeterminate.  ? 2. Right ventricular systolic function is normal. The right ventricular  ?size is normal. There is normal pulmonary artery systolic pressure. The  ?estimated right ventricular systolic pressure is 73.5 mmHg.  ? 3. Left atrial size was moderately dilated.  ? 4. Right atrial size was mildly dilated.  ? 5. The mitral valve is normal in structure. Mild mitral valve  ?regurgitation. No evidence of mitral stenosis.  ? 6. The aortic valve is normal in structure. Aortic valve regurgitation is  ?not visualized. No aortic stenosis is present.  ? 7. The inferior vena cava is normal in size with greater than 50%  ?respiratory variability, suggesting right atrial pressure of 3 mmHg.  ? ?Comparison(s): No significant change from prior study. Prior images  ?reviewed side by side. EF 60%, mild LVH, trivial PE.  ? ?MRA Neck 05/30/2020: ?FINDINGS: This study is of adequate technical quality.  There is anterograde flow in the bilateral vertebral and carotid arteries on 2D-TOF views.   ?  ?The flow signal of the left subclavian artery has no stenosis.  The left common carotid artery  appears normal.  The proximal left internal carotid artery has 10 to 20% stenosis.  The left external carotid artery appears normal.  The left vertebral artery has no stenosis from its origin up to the vertebrobasilar junction.   ?  ?On the right, the brachiocephalic trunk and subclavian arteries have no stenosis. The right common carotid artery appears normal.  The right internal carotid artery appears normal.  The right external carotid artery has 25-30% stenosis at the origin. The right vertebral artery has no stenosis from its origin to the vertebrobasilar junction. Limited views of the intracranial vasculature are unremarkable. ?  ?  ?IMPRESSION: This MR angiogram of the neck arteries with and without contrast shows the following: ?1.    10 to 20% stenosis of the  proximal left internal carotid artery.  This is not hemodynamically significant. ?2.    25 to 30% stenosis of the right external carotid artery at the origin.  This is not hemodynamically significant. ? ?MRA Head 05/30/2020: ?FINDINGS: The imaged extracranial and intracranial portions of the internal carotid arteries appear normal. The middle cerebral and anterior cerebral arteries appear normal. ?  ?In the posterior circulation, the vertebral arteries are co-dominant. No stenosis is noted within the vertebral arteries and the basilar arteries.  There is a fetal origin of the right posterior cerebral artery deriving its flow from the anterior circulation to the posterior communicating artery.  The left posterior cerebral artery derived the majority of the flow from the anterior circulation. ?  ?No aneurysms were identified. ?  ?IMPRESSION: This is a normal MR angiogram of the intracranial arteries.  Incidental note is made of a fetal origin of the right cerebral artery, normal variant. ? ?CT Chest ([facility]) 03/02/2020: ? Comparison Exams:  03/04/2019  ? ?Neck base is normal. The heart is mildly enlarged but similar to the prior  ?study. There are mild  coronary artery atherosclerotic calcifications. No  ?pericardial effusion.  ? ?Impression:  ? ?1. No evidence of lymphadenopathy. Previously seen mildly enlarged nodes  ?have normalized in size may be reactive.  ?2.

## 2021-09-04 ENCOUNTER — Ambulatory Visit: Payer: Medicare Other | Admitting: Neurology

## 2022-01-08 ENCOUNTER — Ambulatory Visit: Payer: Medicare Other | Admitting: Neurology

## 2022-01-10 ENCOUNTER — Ambulatory Visit: Payer: Medicare Other | Admitting: Neurology

## 2022-10-03 ENCOUNTER — Telehealth: Payer: Self-pay

## 2022-10-03 NOTE — Telephone Encounter (Signed)
That's fine with me thanks 

## 2022-10-03 NOTE — Telephone Encounter (Signed)
We received a new referral for this pt of Dr Ronnald Ramp, LS 2022. The new referral is from Dr Lovell Sheehan at [facility] for BLE Weakness, ? Myopathy. The patient is requesting a provider switch from Dr Pearlean Brownie to Dr Lucia Gaskins per the recommendation by Dr Lovell Sheehan.   Please advise if this is acceptable.  Thank you!

## 2022-10-16 ENCOUNTER — Encounter (HOSPITAL_BASED_OUTPATIENT_CLINIC_OR_DEPARTMENT_OTHER): Payer: Self-pay | Admitting: Family

## 2022-10-16 ENCOUNTER — Ambulatory Visit (INDEPENDENT_AMBULATORY_CARE_PROVIDER_SITE_OTHER): Payer: Medicare Other | Admitting: Family

## 2022-10-16 VITALS — BP 139/82 | HR 77 | Ht 67.0 in | Wt 193.0 lb

## 2022-10-16 DIAGNOSIS — I4819 Other persistent atrial fibrillation: Secondary | ICD-10-CM | POA: Diagnosis not present

## 2022-10-16 DIAGNOSIS — D6859 Other primary thrombophilia: Secondary | ICD-10-CM

## 2022-10-16 DIAGNOSIS — R5383 Other fatigue: Secondary | ICD-10-CM | POA: Diagnosis not present

## 2022-10-16 DIAGNOSIS — R600 Localized edema: Secondary | ICD-10-CM

## 2022-10-16 DIAGNOSIS — R531 Weakness: Secondary | ICD-10-CM | POA: Diagnosis not present

## 2022-10-16 MED ORDER — METOPROLOL SUCCINATE ER 25 MG PO TB24: 25.0000 mg | ORAL_TABLET | Freq: Every day | ORAL | 11 refills | Status: DC

## 2022-10-16 NOTE — Progress Notes (Unsigned)
Cardiology Office Note:  .   Date:  10/18/2022  ID:  Rebecca Houston, DOB [birth year], MRN [id number] PCP: Aniceto Boss, MD  [facility] Providers Cardiologist:  Jodelle Red, MD    History of Present Illness: .   Rebecca Houston is a 77 y.o. female PAF, HTN, fatigue, UTI, anxiety, depression  She was seen by PCP for weakness in her legs and persistent malaise.  She noted occasional episodes of palpitations.  ZIO monitor 08/2022 with 100% burden of atrial fibrillation with average of 85 bpm, 2 runs of VT longest 15 beats at rate of 135 bpm and fastest 4 beats with max rate of 143 bpm.  These were not triggered episodes.  Both occurred during waking hours.  During daytime hours her atrial fibrillation was controlled 90% of the time with heart rate between 110-150 bpm 9% of the time.  Triggered events atrial fibrillation 141 bpm.  Labs via PCP 09/03/2022 hemoglobin 12.1, hematocrit 35.3, vitamin D 34, TSH 1.33, free T41.2, vitamin B12 254, total cholesterol 171, TSH 133, HDL 40, LDL 104, CPK 51, creatinine 1.12, K3 point, GFR 51, AST 15, ALT 19  Presents today for follow up with her husband. States one of her goals is to be on less medicine. Notes feeling more tired and her legs would feel weak. Orthopedics found bulging disc on workup. Most concerned by her generalized weakness and how tired she feels. Does not feel she can do as much as she should be able. Understandably frustrated by her multiple medications. Notes Friday she felt her atrial fibrillation for the first time in a long time. Also felt more palpitations Sunday. Otherwise unaware of her atrial fibrillation.   Notes she is not moving much recently to cause any dyspnea. Bilateral ankle swelling L>R. Does wear compression stockings during the day at home and elevates leg. Does not wake feeling tired. She sometimes takes a nap after lunch and feels refreshed for a short time.  Her back pain limits her activity.   ROS: Please see  the history of present illness.    All other systems reviewed and are negative.   Studies Reviewed: .        Cardiac Studies & Procedures       ECHOCARDIOGRAM  ECHOCARDIOGRAM COMPLETE 02/23/2021  Narrative ECHOCARDIOGRAM REPORT    Patient Name:   Rebecca Houston Date of Exam: 02/23/2021 Medical Rec #:  [id number]     Height:       67.0 in Accession #:    [id number]    Weight:       209.5 lb Date of Birth:  09/09/1945     BSA:          2.063 m Patient Age:    75 years      BP:           140/70 mmHg Patient Gender: F             HR:           67  bpm. Exam Location:  Outpatient  Procedure: 2D Echo, Color Doppler and Limited Color Doppler  Indications:    R06.9 DOE; R60.0 Lower extremity edema; R55 Syncope  History:        Patient has prior history of Echocardiogram examinations, most recent 09/05/2018. Stroke, Arrythmias:Atrial Fibrillation, Signs/Symptoms:Syncope, Dyspnea and Edema; Risk Factors:Hypertension, Dyslipidemia and Former Smoker. Patient denies chest pain but does have DOE with leg edema. She contracted CoVid-19 01/2020. She experienced near syncopal episodes a few  times and since medication has been adjusted they have subsided.  Sonographer:    Carlos American RVT, RDCS (AE), RDMS Referring Phys: [phone] [facility]   Sonographer Comments: Suboptimal parasternal window and patient is morbidly obese. Image acquisition challenging due to patient body habitus. Global longitudinal strain was attempted. IMPRESSIONS   1. Left ventricular ejection fraction, by estimation, is 55 to 60%. The left ventricle has normal function. The left ventricle has no regional wall motion abnormalities. Left ventricular diastolic parameters are indeterminate. 2. Right ventricular systolic function is normal. The right ventricular size is normal. There is normal pulmonary artery systolic pressure. The estimated right ventricular systolic pressure is 25.5 mmHg. 3. Left atrial size  was moderately dilated. 4. Right atrial size was mildly dilated. 5. The mitral valve is normal in structure. Mild mitral valve regurgitation. No evidence of mitral stenosis. 6. The aortic valve is normal in structure. Aortic valve regurgitation is not visualized. No aortic stenosis is present. 7. The inferior vena cava is normal in size with greater than 50% respiratory variability, suggesting right atrial pressure of 3 mmHg.  Comparison(s): No significant change from prior study. Prior images reviewed side by side. EF 60%, mild LVH, trivial PE.  FINDINGS Left Ventricle: Left ventricular ejection fraction, by estimation, is 55 to 60%. The left ventricle has normal function. The left ventricle has no regional wall motion abnormalities. The left ventricular internal cavity size was normal in size. There is no left ventricular hypertrophy. Left ventricular diastolic parameters are indeterminate.  Right Ventricle: The right ventricular size is normal. No increase in right ventricular wall thickness. Right ventricular systolic function is normal. There is normal pulmonary artery systolic pressure. The tricuspid regurgitant velocity is 2.37 m/s, and with an assumed right atrial pressure of 3 mmHg, the estimated right ventricular systolic pressure is 25.5 mmHg.  Left Atrium: Left atrial size was moderately dilated.  Right Atrium: Right atrial size was mildly dilated.  Pericardium: There is no evidence of pericardial effusion.  Mitral Valve: The mitral valve is normal in structure. Mild mitral valve regurgitation. No evidence of mitral valve stenosis.  Tricuspid Valve: The tricuspid valve is normal in structure. Tricuspid valve regurgitation is mild . No evidence of tricuspid stenosis.  Aortic Valve: The aortic valve is normal in structure. Aortic valve regurgitation is not visualized. No aortic stenosis is present. Aortic valve mean gradient measures 2.0 mmHg. Aortic valve peak gradient measures  3.4 mmHg. Aortic valve area, by VTI measures 2.96 cm.  Pulmonic Valve: The pulmonic valve was normal in structure. Pulmonic valve regurgitation is trivial. No evidence of pulmonic stenosis.  Aorta: The aortic root is normal in size and structure.  Venous: The inferior vena cava is normal in size with greater than 50% respiratory variability, suggesting right atrial pressure of 3 mmHg.  IAS/Shunts: No atrial level shunt detected by color flow Doppler.   LEFT VENTRICLE PLAX 2D LVIDd:         4.62 cm   Diastology LVIDs:         2.90 cm   LV e' medial:    9.90 cm/s LV PW:         1.07 cm   LV E/e' medial:  12.4 LV IVS:        0.95 cm   LV e' lateral:   13.50 cm/s LVOT diam:     2.30 cm   LV E/e' lateral: 9.1 LV SV:         55 LV SV Index:  27 LVOT Area:     4.15 cm  3D Volume EF: 3D EF:        54 % LV EDV:       96 ml LV ESV:       44 ml LV SV:        52 ml  RIGHT VENTRICLE RV S prime:     10.60 cm/s TAPSE (M-mode): 1.6 cm  LEFT ATRIUM              Index        RIGHT ATRIUM           Index LA diam:        4.10 cm  1.99 cm/m   RA Area:     12.70 cm LA Vol (A2C):   103.0 ml 49.94 ml/m  RA Volume:   27.30 ml  13.24 ml/m LA Vol (A4C):   81.8 ml  39.66 ml/m LA Biplane Vol: 94.1 ml  45.62 ml/m AORTIC VALVE                    PULMONIC VALVE AV Area (Vmax):    3.40 cm     PV Vmax:          0.94 m/s AV Area (Vmean):   3.08 cm     PV Peak grad:     3.5 mmHg AV Area (VTI):     2.96 cm     PR End Diast Vel: 2.50 msec AV Vmax:           91.70 cm/s AV Vmean:          65.650 cm/s AV VTI:            0.186 m AV Peak Grad:      3.4 mmHg AV Mean Grad:      2.0 mmHg LVOT Vmax:         75.00 cm/s LVOT Vmean:        48.600 cm/s LVOT VTI:          0.132 m LVOT/AV VTI ratio: 0.71  AORTA Ao Root diam: 3.10 cm Ao Asc diam:  3.50 cm Ao Arch diam: 3.2 cm  MITRAL VALVE                TRICUSPID VALVE MV Area (PHT): 4.74 cm     TR Peak grad:   22.5 mmHg MV Decel Time: 160 msec      TR Vmax:        237.00 cm/s MV E velocity: 123.00 cm/s SHUNTS Systemic VTI:  0.13 m Systemic Diam: 2.30 cm  Donato Schultz MD Electronically signed by Donato Schultz MD Signature Date/Time: 02/23/2021/10:43:43 AM    Final             Risk Assessment/Calculations:    CHA2DS2-VASc Score = 4   This indicates a 4.8% annual risk of stroke. The patient's score is based upon: CHF History: 0 HTN History: 1 Diabetes History: 0 Stroke History: 0 Vascular Disease History: 0 Age Score: 2 Gender Score: 1           Physical Exam:   VS:  BP 139/82   Pulse 77   Ht 5\' 7"  (1.702 m)   Wt 193 lb (87.5 kg)   SpO2 99%   BMI 30.23 kg/m    Wt Readings from Last 3 Encounters:  10/16/22 193 lb (87.5 kg)  06/13/21 200 lb (90.7 kg)  02/03/21 209 lb 8 oz (95 kg)  GEN: Well nourished, well developed in no acute distress NECK: No JVD; No carotid bruits CARDIAC: IRIR, no murmurs, rubs, gallops RESPIRATORY:  Clear to auscultation without rales, wheezing or rhonchi  ABDOMEN: Soft, non-tender, non-distended EXTREMITIES:  No edema; No deformity   ASSESSMENT AND PLAN: .    Generalized weakness / Activity intolerance - No chest pain suggestive of angina. Symptoms have been ongoing and somewhat progressive. Workup with PCP TSH, B12, vitamin D, Hb unremarkable. Given her longstanding rate controlled atrial fibrillation since 11/2020 and only 2 brief episodes of palpitations low suspicion it is contributory. Recent monitor with PCP with 100% burden with afib rate controlled average HR 85 bpm. Update echo to rule out heart failure and reassess atria size. If not severely dilated, could consider cardioversion to assess energy when in NSR.   Persistent atrial fibrillation / Hypercoagulable state - As above. Continue Toprol 25mg  daily, Xarelto 20mg  daily. CHA2DS2-VASc Score = 4 [CHF History: 0, HTN History: 1, Diabetes History: 0, Stroke History: 0, Vascular Disease History: 0, Age Score: 2, Gender Score: 1].   Therefore, the patient's annual risk of stroke is 4.8 %.      LE edema - Controlled on Lasix.   HTN - BP not at goal <130/80 though mildly elevated. Transition Metoprolol Tartrate to Succinate for easier daily dosing. Discussed to monitor BP at home at least 2 hours after medications and sitting for 5-10 minutes. If persistently elevated at follow up, consider increasing Amlodipine vs Lisinopril.       Dispo: follow up in 6-8 weeks.   Signed, Alver Sorrow, NP

## 2022-10-16 NOTE — Patient Instructions (Addendum)
Medication Instructions:  Your physician has recommended you make the following change in your medication:   STOP Metoprolol Tartrate  START Metoprolol Succinate 25mg  daily   *If you need a refill on your cardiac medications before your next appointment, please call your pharmacy*   Testing/Procedures: Your physician has requested that you have an echocardiogram. Echocardiography is a painless test that uses sound waves to create images of your heart. It provides your doctor with information about the size and shape of your heart and how well your heart's chambers and valves are working. This procedure takes approximately one hour. There are no restrictions for this procedure. Please do NOT wear cologne, perfume, aftershave, or lotions (deodorant is allowed). Please arrive 15 minutes prior to your appointment time.    Follow-Up: At [facility], you and your health needs are our priority.  As part of our continuing mission to provide you with exceptional heart care, we have created designated Provider Care Teams.  These Care Teams include your primary Cardiologist (physician) and Advanced Practice Providers (APPs -  Physician Assistants and Nurse Practitioners) who all work together to provide you with the care you need, when you need it.  We recommend signing up for the patient portal called "MyChart".  Sign up information is provided on this After Visit Summary.  MyChart is used to connect with patients for Virtual Visits (Telemedicine).  Patients are able to view lab/test results, encounter notes, upcoming appointments, etc.  Non-urgent messages can be sent to your provider as well.   To learn more about what you can do with MyChart, go to [email or web address].    Your next appointment:   6-8 weeks  Provider:   Jodelle Red, MD or Gillian Shields, NP    Other Instructions   To prevent or reduce lower extremity swelling: Eat a low salt diet. Salt makes the body  hold onto extra fluid which causes swelling. Sit with legs elevated. For example, in the recliner or on an ottoman.  Wear knee-high compression stockings during the daytime. Ones labeled 15-20 mmHg provide good compression.   To prevent palpitations: Make sure you are adequately hydrated.  Avoid and/or limit caffeine containing beverages like soda or tea. Exercise regularly.  Manage stress well. Some over the counter medications can cause palpitations such as Benadryl, AdvilPM, TylenolPM. Regular Advil or Tylenol do not cause palpitations.

## 2022-10-18 ENCOUNTER — Encounter (HOSPITAL_BASED_OUTPATIENT_CLINIC_OR_DEPARTMENT_OTHER): Payer: Self-pay | Admitting: Family

## 2022-11-02 ENCOUNTER — Other Ambulatory Visit (HOSPITAL_BASED_OUTPATIENT_CLINIC_OR_DEPARTMENT_OTHER): Payer: Medicare Other

## 2022-11-02 ENCOUNTER — Ambulatory Visit (INDEPENDENT_AMBULATORY_CARE_PROVIDER_SITE_OTHER): Payer: Medicare Other

## 2022-11-02 DIAGNOSIS — R600 Localized edema: Secondary | ICD-10-CM | POA: Diagnosis not present

## 2022-11-02 DIAGNOSIS — I4819 Other persistent atrial fibrillation: Secondary | ICD-10-CM

## 2022-11-02 LAB — ECHOCARDIOGRAM COMPLETE
Area-P 1/2: 2.37 cm2
MV M vel: 2.92 m/s
MV Peak grad: 34.1 mmHg
S' Lateral: 3.22 cm

## 2022-11-07 ENCOUNTER — Telehealth (HOSPITAL_BASED_OUTPATIENT_CLINIC_OR_DEPARTMENT_OTHER): Payer: Self-pay | Admitting: Cardiology

## 2022-11-07 NOTE — Telephone Encounter (Signed)
Patient called to follow-up on test results.

## 2022-11-07 NOTE — Telephone Encounter (Signed)
RETURNED CALL TO PATIENT, NO ANSWER, LEFT DETAILED MESSAGE (OK PER DPR)     "Echocardiogram low normal heart pumping function.  Bilateral atria normal in size.  Mild leaking of the mitral valve which is not of concern.  We prevent this from worsening by keeping blood pressure well-controlled. Overall no significant abnormalities. Good result! Follow up as scheduled. "   RESULTS PREVIOUSLY RELEASED TO MYCHART & NOT VIEWED, MAILED TO PATIENT.

## 2022-11-08 ENCOUNTER — Telehealth (HOSPITAL_BASED_OUTPATIENT_CLINIC_OR_DEPARTMENT_OTHER): Payer: Self-pay | Admitting: Cardiology

## 2022-11-08 NOTE — Telephone Encounter (Signed)
Returned call to patient as requested, no answer, left detailed message with provider response.

## 2022-11-08 NOTE — Telephone Encounter (Signed)
Echocardiogram shows her atria are not significantly dilated from her atrial fibrillation which is good.  She and Dr. Cristal Deer can discuss next steps regarding her atrial fibrillation at follow-up.  Alver Sorrow, NP

## 2022-11-08 NOTE — Telephone Encounter (Signed)
Please advise and happy to call her back

## 2022-11-08 NOTE — Telephone Encounter (Signed)
New Message:      Patient called. She said she had received Echo results.She wanted to know if the Echo results showed anything about her Afib. She said that was the reason she had the Echo, because of her Afib. She said to please call her after 10:00 today.

## 2022-11-27 ENCOUNTER — Ambulatory Visit (HOSPITAL_BASED_OUTPATIENT_CLINIC_OR_DEPARTMENT_OTHER): Payer: Medicare Other | Admitting: Cardiology

## 2022-11-27 ENCOUNTER — Encounter (HOSPITAL_BASED_OUTPATIENT_CLINIC_OR_DEPARTMENT_OTHER): Payer: Self-pay | Admitting: Cardiology

## 2022-11-27 VITALS — BP 116/72 | HR 70 | Ht 67.0 in | Wt 190.1 lb

## 2022-11-27 DIAGNOSIS — Z712 Person consulting for explanation of examination or test findings: Secondary | ICD-10-CM

## 2022-11-27 DIAGNOSIS — I4811 Longstanding persistent atrial fibrillation: Secondary | ICD-10-CM

## 2022-11-27 DIAGNOSIS — Z7901 Long term (current) use of anticoagulants: Secondary | ICD-10-CM | POA: Diagnosis not present

## 2022-11-27 DIAGNOSIS — Z8673 Personal history of transient ischemic attack (TIA), and cerebral infarction without residual deficits: Secondary | ICD-10-CM

## 2022-11-27 DIAGNOSIS — D6869 Other thrombophilia: Secondary | ICD-10-CM | POA: Diagnosis not present

## 2022-11-27 NOTE — Patient Instructions (Addendum)
Medication Instructions:  Your physician recommends that you continue on your current medications as directed. Please refer to the Current Medication list given to you today.  *If you need a refill on your cardiac medications before your next appointment, please call your pharmacy*  Lab Work: NONE  Testing/Procedures: NONE  Follow-Up: At [facility], you and your health needs are our priority.  As part of our continuing mission to provide you with exceptional heart care, we have created designated Provider Care Teams.  These Care Teams include your primary Cardiologist (physician) and Advanced Practice Providers (APPs -  Physician Assistants and Nurse Practitioners) who all work together to provide you with the care you need, when you need it.  We recommend signing up for the patient portal called "MyChart".  Sign up information is provided on this After Visit Summary.  MyChart is used to connect with patients for Virtual Visits (Telemedicine).  Patients are able to view lab/test results, encounter notes, upcoming appointments, etc.  Non-urgent messages can be sent to your provider as well.   To learn more about what you can do with MyChart, go to [email or web address].    Your next appointment:   6 month(s)  The format for your next appointment:   In Person  Provider:   Jodelle Red, MD     Rosanna Randy

## 2022-11-27 NOTE — Progress Notes (Signed)
Cardiology Office Note:  .    Date:  11/27/2022  ID:  Rebecca Houston, DOB 03-03-1945, MRN [id number] PCP: Aniceto Boss, MD  [facility] Providers Cardiologist:  Jodelle Red, MD     History of Present Illness: .    Rebecca Houston is a 77 y.o. female with a hx of hypertension, atrial fibrillation, hyperlipidemia, history of syncope. She was initially seen 01/20/2019 as a new consult at the request of Aniceto Boss, MD for the evaluation and management of atrial fibrillation, hypertension, hyperlipidemia.   At her visit 06/2021, she noted fewer dizzy spells with decreased metoprolol dose and no syncope. Ankle swelling was much improved with compression stockings. She continued to work on weight loss although walking was somewhat limited by chronic hip pain. Blood pressures were well controlled.   She followed up with Gillian Shields, NP 10/2022 and complained of progressive fatigue and generalized weakness. She had also noted a recent episode of Afib which was the first time she felt in a long while. Updated echo revealed LVEF 50-55%, normal PASP, mild mitral regurgitation, mild aortic calcifications. Her blood pressure had also been mildly elevated. She was transitioned from metoprolol tartrate, to metoprolol succinate for easier daily dosing.  Today, she is accompanied by a family member. She is concerned about swelling and skin discolorations of her bilateral LE, up to her knees. On physical exam this appears to be consistent with venous insufficiency. She also has more significant swelling in her left ankle which has been more chronic. At home she does wear compression hose and elevates her legs when able. We reviewed her echo results at length today.  Her EKG showed that she is in atrial fibrillation. She is currently not aware that she is in Afib; no palpitations. Initially she was more aware of her arrhythmias when her Afib was newly onset. We also discussed anti-arrhythmic  strategies in detail.  She struggles with chronic back pain, hip pain, and significant weakness in her bilateral calves. She has completed 4 weeks of physical therapy and water aerobics.  She denies any chest pain, shortness of breath, lightheadedness, headaches, syncope, orthopnea, or PND.  ROS:  Please see the history of present illness. ROS otherwise negative except as noted.  (+) LE edema with venous stasis changes (+) Chronic back and hip pain (+) Weakness of bilateral calves (+) Easy bruising on Xarelto  Studies Reviewed: Marland Kitchen    EKG Interpretation Date/Time:  Tuesday November 27 2022 09:47:19 EDT Ventricular Rate:  92 PR Interval:    QRS Duration:  88 QT Interval:  382 QTC Calculation: 472 R Axis:   40  Text Interpretation: Atrial fibrillation When compared with ECG of 05-Sep-2018 11:24, No significant change was found Confirmed by Jodelle Red [phone]) on 11/27/2022 9:52:17 AM    Echo  11/02/2022:  1. Left ventricular ejection fraction, by estimation, is 50 to 55%. The  left ventricle has low normal function. The left ventricle has no regional  wall motion abnormalities. Left ventricular diastolic function could not  be evaluated.   2. Right ventricular systolic function is low normal. The right  ventricular size is normal. There is normal pulmonary artery systolic  pressure. The estimated right ventricular systolic pressure is 25.8 mmHg.   3. The mitral valve is grossly normal. Mild mitral valve regurgitation.  No evidence of mitral stenosis.   4. The aortic valve is tricuspid. There is mild calcification of the  aortic valve. Aortic valve regurgitation is not visualized. Aortic valve  sclerosis is present, with no evidence of aortic valve stenosis.   5. The inferior vena cava is normal in size with greater than 50%  respiratory variability, suggesting right atrial pressure of 3 mmHg.   Comparison(s): Changes from prior study are noted. LVEF now 50-55%.    Physical Exam:    VS:  BP 116/72   Pulse 70   Ht 5\' 7"  (1.702 m)   Wt 190 lb 1.6 oz (86.2 kg)   SpO2 98%   BMI 29.77 kg/m    Wt Readings from Last 3 Encounters:  11/27/22 190 lb 1.6 oz (86.2 kg)  10/16/22 193 lb (87.5 kg)  06/13/21 200 lb (90.7 kg)    GEN: Well nourished, well developed in no acute distress HEENT: Normal, moist mucous membranes NECK: No JVD CARDIAC: Irregularly irregular rhythm, normal S1 and S2, no rubs or gallops. No murmur. VASCULAR: Radial and DP pulses 2+ bilaterally. No carotid bruits RESPIRATORY:  Clear to auscultation without rales, wheezing or rhonchi  ABDOMEN: Soft, non-tender, non-distended MUSCULOSKELETAL:  Ambulates independently SKIN: Warm and dry, bilateral edema and skin discoloration consistent with venous insufficiency. Diffuse ecchymoses NEUROLOGIC:  Alert and oriented x 3. No focal neuro deficits noted. PSYCHIATRIC:  Normal affect   ASSESSMENT AND PLAN: .    Persistent atrial fibrillation:  -CHA2DS2/VAS Stroke Risk Points= 5 -rate control with metoprolol -long term anticoagulation with rivaroxaban -we discussed rate vs rhythm control at length today, including cardioversion with antiarrhythmic. For now, she would prefer to manage with rate control. Also discussed Watchman, risks/benefits. She will continue with DOAC for now but will contact me if she changes her mind.  Leg swelling/discoloration, improved Dyspnea on exertion, improved -improves with elevation, compression -most consistent with venous stasis, doing well -has PRN furosemide for severe swelling, has not required -reviewed echo   Near syncope: -history of such, with rare full syncope -workup has been unremarkable from a cardiac standpoint. No palpitations prior to events, improve with sitting down -discussed hydration, lying down and elevating feet when she feels presyncopal -much improved with cutting metoprolol dose   history of CVA:  -followed with Dr. Pearlean Brownie -on  rivaroxaban -LDL 91. I am not sure if prior stroke(s) were embolic, but recent imaging no hemodynamically significant stenosis (10-20% LICA, 25-30% RECA) -given history of CVA, I would recommend statin and goal LDL <70. She declines at this time   Hypertension: -at goal today -continue amlodipine 5 mg daily -continue lisinopril 20 mg BID (patient preference), metoprolol tartrate 25 mg BID   Cardiac risk counseling and prevention recommendations: -recommend heart healthy/Mediterranean diet, with whole grains, fruits, vegetable, fish, lean meats, nuts, and olive oil. Limit salt. -recommend moderate walking, 3-5 times/week for 30-50 minutes each session. Aim for at least 150 minutes.week. Goal should be pace of 3 miles/hours, or walking 1.5 miles in 30 minutes -recommend avoidance of tobacco products. Avoid excess alcohol.  Dispo: Follow-up in 6 months, or sooner as needed.  I,Mathew Stumpf,acting as a Neurosurgeon for [employer], MD.,have documented all relevant documentation on the behalf of Jodelle Red, MD,as directed by  Jodelle Red, MD while in the presence of Jodelle Red, MD.  I, Jodelle Red, MD, have reviewed all documentation for this visit. The documentation on 11/27/22 for the exam, diagnosis, procedures, and orders are all accurate and complete.   Signed, Jodelle Red, MD

## 2023-01-21 ENCOUNTER — Ambulatory Visit (INDEPENDENT_AMBULATORY_CARE_PROVIDER_SITE_OTHER): Payer: Medicare Other | Admitting: Neurology

## 2023-01-21 ENCOUNTER — Encounter: Payer: Self-pay | Admitting: Neurology

## 2023-01-21 VITALS — BP 118/78 | HR 72 | Ht 67.0 in | Wt 185.0 lb

## 2023-01-21 DIAGNOSIS — M5416 Radiculopathy, lumbar region: Secondary | ICD-10-CM | POA: Diagnosis not present

## 2023-01-21 DIAGNOSIS — M79604 Pain in right leg: Secondary | ICD-10-CM | POA: Diagnosis not present

## 2023-01-21 NOTE — Progress Notes (Signed)
[facility]    Provider:  Dr Lucia Gaskins Requesting Provider: Tressie Stalker, MD Primary Care Provider:  Aniceto Boss, MD  CC:  Leg weaknes, improving  HPI:  Illene Houston is a 77 y.o. female here as requested by Tressie Stalker, MD for leg weakness. HPI:  Rebecca Houston is a 77 y.o. female here as requested by Tressie Stalker, MD for leg weakness. has Lumbar stenosis with neurogenic claudication; Herniated cervical disc; HNP (herniated nucleus pulposus), cervical; Biliary dyskinesia; Abdominal pain; Long term current use of anticoagulant; Paroxysmal atrial fibrillation (HCC); Labile hypertension; Essential hypertension; Personal history of COVID-19; History of CVA (cerebrovascular accident); Secondary hypercoagulable state (HCC); Venous insufficiency of both lower extremities; and Longstanding persistent atrial fibrillation (HCC) on their problem list.   She has lower back pain but going to PT and chirppraco and helping. She had a stroke in covid and that was because she of off of xarelto. On xarelto for afib. I reviewd [facility]'s notes: She hs a little wekaness on the right side of the leg now but improved. She has been going to therapy and chiropractic. Looks AMZINF for age, much younge than stated age. We discussed memory and testing and formal testimg, she is not interetsed. She is going to have to have an emg/ncs. A;readyin PT/Chiro/Follows with Dr. Lovell Sheehan and Sammie Bench in [employer]. Already had a though workup. No problems swalloing. No facial weakness. No problems taking or breathing. No underintentional weight loss or muscle mass loss. She has had back pain on and off for years. Recently worse, had an MRi. Bulging disk, not bad enough for surgeical, she has improved, some residual prox left leg weakness otherwise feeling well, no rash, can lift arms overhead with ease, can get off the florr with effort but that's probablt age. She look lovel, MUCH younger  looking than stated age. No falls, no precipitating events. No radicular symptoms, no numbness or tingling or falls or mempry loss mpore than she would expect for age. Mothre had dementia late onset in 89 and was a heavy smojer. Steroids made a difference. Low nback pain is beterer, she is taking care of herself, execiging, trying to lose more weight, wish I could clone her as all my patients!  Reviewed notes, labs and imaging from outside physicians, which showed:  Recent Results (from the past 2160 hour(s))  ECHOCARDIOGRAM COMPLETE     Status: None   Collection Time: 11/02/22 10:28 AM  Result Value Ref Range   S' Lateral 3.22 cm   Area-P 1/2 2.37 cm2   MV M vel 2.92 m/s   MV Peak grad 34.1 mmHg   Est EF 50 - 55%       Latest Ref Rng & Units 09/03/2018    8:35 AM 04/11/2017    9:03 AM 03/27/2016    2:15 PM  CMP  Glucose 70 - 99 mg/dL 308  657  846   BUN 8 - 23 mg/dL 22  21  19    Creatinine 0.44 - 1.00 mg/dL 9.62  9.52  8.41   Sodium 135 - 145 mmol/L 140  138  139   Potassium 3.5 - 5.1 mmol/L 4.0  3.6  3.9   Chloride 98 - 111 mmol/L 103  102  102   CO2 22 - 32 mmol/L 28  25  27    Calcium 8.9 - 10.3 mg/dL 8.9  8.8  9.5       Latest Ref Rng & Units 09/04/2018   11:53 PM 09/03/2018  8:35 AM 04/11/2017    9:03 AM  CBC  WBC 4.0 - 10.5 K/uL  8.4  7.3   Hemoglobin 12.0 - 15.0 g/dL 75.6  43.3  29.5   Hematocrit 36.0 - 46.0 % 37.2  38.3  35.8   Platelets 150 - 400 K/uL  193  197      EXAM: MRI Brain with and without contrast   ORDERING CLINICIAN: Pred Mild Sethi MD CLINICAL HISTORY: 77 year old woman with stroke and memory loss COMPARISON FILMS: None   TECHNIQUE:MRI of the brain with and without contrast was obtained utilizing 5 mm axial slices with T1, T2, T2 flair, SWI and diffusion weighted views.  T1 sagittal, T2 coronal and postcontrast views in the axial and coronal plane were obtained. CONTRAST: 19 ml [facility] IMAGING SITE: [facility], [address] [place].    FINDINGS: On sagittal images, the spinal cord is imaged caudally to C3 and is normal in caliber.   The contents of the posterior fossa are of normal size and position.   The pituitary gland and optic chiasm appear normal.    Brain volume appears normal.   The ventricles are normal in size and without distortion.  There are no abnormal extra-axial collections of fluid.     There is evidence of prior left craniotomy.  A homogenously enhancing extra-axial mass is noted just anterior to the craniotomy measuring 9 x 9 x 7 mm (AP x cephalocaudal x transverse).  This is consistent with meningioma.  Precraniotomy images are not available for comparison   There is a small lacunar infarction in the left cerebellar hemisphere.  In the cerebral hemispheres there are extensive T2/FLAIR hyperintense single and confluent foci predominantly in the deep and subcortical white matter.  Superimposed, there is a small posterior frontal right subcortical infarction.  None of the foci are acute.  They do not enhance.   Diffusion weighted images are normal.  Susceptibility weighted images are normal.      There have been bilateral lens replacements.  Otherwise, the orbits appear normal.   The VIIth/VIIIth nerve complex appears normal.  The mastoid air cells appear normal.  The paranasal sinuses appear normal.  Flow voids are identified within the major intracerebral arteries.     After the infusion of contrast material, a normal enhancement pattern is noted.     IMPRESSION: This MRI of the brain with and without contrast shows the following: 1.   Extensive T2/FLAIR foci in the hemispheres consistent with advanced chronic microvascular ischemic change.  Additionally, there is a small superimposed subcortical infarction in the posterior left frontal lobe and a small lacunar infarction in the left cerebral hemisphere. 2.   Previous left craniotomy.  Adjacent to the surgery there is a 9 x 9 x 7 mm homogenously enhancing  extra-axial mass consistent with meningioma. 3.   No acute findings.       INTERPRETING PHYSICIAN:  Richard A. Epimenio Foot, MD, PhD, FAAN Certified in  Neuroimaging by [employer]      Narrative  CLINICAL DATA:  Lumbar spine stenosis with neurogenic claudication. Lumbar spine fusion.  EXAM: DG C-ARM 61-120 MIN; LUMBAR SPINE - 2-3 VIEW  COMPARISON:  None.  FINDINGS: AP and lateral views show ALIF hardware in expected position at ...        Study Result  Narrative & Impression  CLINICAL DATA:  Lumbar spine stenosis with neurogenic claudication. Lumbar spine fusion.   EXAM: DG C-ARM 61-120 MIN; LUMBAR SPINE - 2-3 VIEW  COMPARISON:  None.   FINDINGS: AP and lateral views show ALIF hardware in expected position at L5-S1.   IMPRESSION: ALIF hardware placement at L5-S1.     Electronically Signed   By: Myles Rosenthal M.D.   On: 04/02/2016 10:26    Review of Systems: Patient complains of symptoms per HPI as well as the following symptoms none. Pertinent negatives and positives per HPI. All others negative.   Social History   Socioeconomic History   Marital status: Married    Spouse name: Noralyn Pick   Number of children: 3   Years of education: Not on file   Highest education level: Not on file  Occupational History   Not on file  Tobacco Use   Smoking status: Former    Current packs/day: 0.00    Average packs/day: 0.5 packs/day for 15.0 years (7.5 ttl pk-yrs)    Types: Cigarettes    Start date: 44    Quit date: 2014    Years since quitting: 10.9   Smokeless tobacco: Never  Vaping Use   Vaping status: Never Used  Substance and Sexual Activity   Alcohol use: No   Drug use: No   Sexual activity: Not on file  Other Topics Concern   Not on file  Social History Narrative   Lives with husband   Right Handed   Drinks no caffeine   Social Determinants of Health   Financial Resource Strain: Not on file  Food Insecurity: Not on file   Transportation Needs: Not on file  Physical Activity: Not on file  Stress: Not on file  Social Connections: Not on file  Intimate Partner Violence: Not on file    Family History  Problem Relation Age of Onset   Stroke Mother    Dementia Mother    Heart disease Father    Breast cancer Sister     Past Medical History:  Diagnosis Date   A-fib [facility])    Anxiety    Arthritis    Basal cell carcinoma    Brain tumor (benign) (HCC)    Bruises easily    Cancer (HCC)    melanoma on neck   Depression    Heartburn    History of kidney stones    Hypertension    ILD (interstitial lung disease) (HCC) 01/2018   Early   Lumbar foraminal stenosis    L5-S1   Melanoma (HCC)    PONV (postoperative nausea and vomiting)    Psoriasis    Pulmonary nodules    Stable bilateral sub-6 mm pulmonary nodules    Renal cyst     Patient Active Problem List   Diagnosis Date Noted   Lumbar radiculopathy 01/21/2023   Right leg pain 01/21/2023   Secondary hypercoagulable state (HCC) 06/13/2021   Venous insufficiency of both lower extremities 06/13/2021   Longstanding persistent atrial fibrillation (HCC) 06/13/2021   History of CVA (cerebrovascular accident) 12/07/2020   Personal history of COVID-19 03/07/2019   Long term current use of anticoagulant 02/17/2019   Paroxysmal atrial fibrillation (HCC) 02/17/2019   Labile hypertension 02/17/2019   Essential hypertension 02/17/2019   Biliary dyskinesia 08/31/2018   Abdominal pain 08/31/2018   HNP (herniated nucleus pulposus), cervical 04/17/2017   Herniated cervical disc 04/16/2017   Lumbar stenosis with neurogenic claudication 04/02/2016    Past Surgical History:  Procedure Laterality Date   ABDOMINAL EXPOSURE N/A 04/02/2016   Procedure: ABDOMINAL EXPOSURE;  Surgeon: Larina Earthly, MD;  Location[facility];  Service: Vascular;  Laterality:  N/A;   ABDOMINAL HYSTERECTOMY     ANTERIOR CERVICAL DECOMP/DISCECTOMY FUSION N/A 04/16/2017   Procedure:  Cervical Four-Five, Cervical Five-Six, Cervical Six-Seven  Anterior cervical decompression/discectomy, fusion;  Surgeon: Maeola Harman, MD;  Location: [facility] OR;  Service: Neurosurgery;  Laterality: N/A;  C4-5 C5-6 C6-7 Anterior cervical decompression/discectomy, fusion   ANTERIOR LUMBAR FUSION N/A 04/02/2016   Procedure: Lumbar five-Sacral one  Anterior lumbar interbody fusion with Dr. Gretta Began;  Surgeon: Maeola Harman, MD;  Location: [facility] OR;  Service: Neurosurgery;  Laterality: N/A;  L5-S1 Anterior lumbar interbody fusion with Dr. Tawanna Cooler Early   BRAIN SURGERY     BREAST REDUCTION SURGERY     CATARACT EXTRACTION, BILATERAL     CHOLECYSTECTOMY N/A 09/04/2018   Procedure: LAPAROSCOPIC CHOLECYSTECTOMY WITH INTRAOPERATIVE CHOLANGIOGRAM;  Surgeon: Darnell Level, MD;  Location: [facility] ORS;  Service: General;  Laterality: N/A;   COLONOSCOPY     GALLBLADDER SURGERY  0723/2020   NECK SURGERY     bone spur    Current Outpatient Medications  Medication Sig Dispense Refill   ALPRAZolam (XANAX) 0.5 MG tablet Take 0.5 mg by mouth 2 (two) times daily as needed for anxiety.     amLODipine (NORVASC) 5 MG tablet TAKE 1 TABLET BY MOUTH ONCE DAILY. 30 tablet 2   buPROPion (WELLBUTRIN XL) 150 MG 24 hr tablet Take 150 mg by mouth daily.     busPIRone (BUSPAR) 15 MG tablet Take 15 mg by mouth 3 (three) times daily.     escitalopram (LEXAPRO) 20 MG tablet Take 20 mg by mouth every evening.     lisinopril (PRINIVIL,ZESTRIL) 20 MG tablet Take 20 mg by mouth daily.     metoprolol succinate (TOPROL XL) 25 MG 24 hr tablet Take 1 tablet (25 mg total) by mouth daily. 30 tablet 11   Potassium Chloride ER 20 MEQ TBCR Take 1 tablet by mouth daily.     rivaroxaban (XARELTO) 20 MG TABS tablet Take 20 mg by mouth daily with supper.     No current facility-administered medications for this visit.    Allergies as of 01/21/2023 - Review Complete 01/21/2023  Allergen Reaction Noted   No known allergies  03/30/2016    Vitals: BP  118/78   Pulse 72   Ht 5\' 7"  (1.702 m)   Wt 185 lb (83.9 kg)   BMI 28.98 kg/m  Last Weight:  Wt Readings from Last 1 Encounters:  01/21/23 185 lb (83.9 kg)   Last Height:   Ht Readings from Last 1 Encounters:  01/21/23 5\' 7"  (1.702 m)     Physical exam: Exam: Gen: NAD, conversant, well nourised, obese, well groomed                     CV: RRR, no MRG. No Carotid Bruits. Slight peripheral edema sital in the left LE otherwise, warm, nontender Eyes: Conjunctivae clear without exudates or hemorrhage  Neuro: Detailed Neurologic Exam  Speech:    Speech is normal; fluent and spontaneous with normal comprehension.  Cognition:    The patient is oriented to person, place, and time;     recent and remote memory intact;     language fluent;     normal attention, concentration,     fund of knowledge Cranial Nerves:    The pupils are equal, round, and reactive to light. Cannot visual . Visual fields are full to finger confrontation. Extraocular movements are intact. Trigeminal sensation is intact and the muscles of mastication are normal. The  face is symmetric. The palate elevates in the midline. Hearing intact. Voice is normal. Shoulder shrug is normal. The tongue has normal motion without fasciculations.   Coordination:    Normal finger to nose and heel to shin. Normal rapid alternating movements.   Gait: nml  Motor Observation:    No asymmetry, no atrophy, and no involuntary movements noted. Tone:    Normal muscle tone.    Posture:    Posture is normal. normal erect    Strength: right hip flexion 4/5.  Otherwise strength is V/V in the upper and lower limbs.      Sensation: intact to LT     Reflex Exam:  DTR's:    Deep tendon reflexes in the upper and lower extremities are slightly brisj bilaterally.   Toes:    The toes are downgoing bilaterally.   Clonus:    Clonus is absent.    Assessment/Plan:  77 y.o. female here as requested by Tressie Stalker, MD for leg  weakness. HPI:  Rebecca Houston is a 77 y.o. female here as requested by Tressie Stalker, MD for leg weakness. has Lumbar stenosis with neurogenic claudication; Herniated cervical disc; HNP (herniated nucleus pulposus), cervical; Biliary dyskinesia; Abdominal pain; Long term current use of anticoagulant; Paroxysmal atrial fibrillation (HCC); Labile hypertension; Essential hypertension; Personal history of COVID-19; History of CVA (cerebrovascular accident); Secondary hypercoagulable state (HCC); Venous insufficiency of both lower extremities; and Longstanding persistent atrial fibrillation (HCC) on their problem list.   She has lower back pain but going to PT and chirppraco and helping. She had a stroke in covid and that was because she of off of xarelto. On xarelto for afib. I reviewd [facility]'s notes: She hs a little wekaness on the right side of the leg now but improved. She has been going to therapy and chiropractic. Looks AMZINF for age, much younge than stated age. We discussed memory and testing and formal testimg, she is not interetsed. She is going to have to have an emg/ncs. A;readyin PT/Chiro/Follows with Dr. Lovell Sheehan and Sammie Bench in [place]. Already had a though workup. No problems swalloing. No facial weakness. No problems taking or breathing. No underintentional weight loss or muscle mass loss.  Ever want a memory eval, please come back If the emg/ncs is abnormal or worsening/new symtpoms comeon back.  Slightly lovely patient.  She is improving.  I reviewed Dr. Verlee Rossetti note my wonderful colleague, she has an MRI of the lumbar spine with previous three-level anterior cervical discectomy fusion and plating she has mild bulging disc and foraminal stenosis L3-L4 on the right which corresponds to her only finding on neurological exam of mild proximal right leg weakness, MRI of the cervical spine did not show any spinal cord damage or cervical stenosis.  She is already under the care  of a doctor and getting an EMG nerve conduction study and she is feeling better I think at this time this lovely patient can follow-up with [place].  As far as memory goes she requires no workup today and declines, I did state we could get a formal neurocognitive test which would be a nice baseline she will let us know.  Follow-up as needed happy to see this patient anytime.  ..  No orders of the defined types were placed in this encounter.  No orders of the defined types were placed in this encounter.   Cc: Tressie Stalker, MD,  Harland Dingwall, Vic Ripper, MD  Naomie Dean, MD  [facility] [address]  [address] [place], [place] [address]  Phone [phone] Fax [phone]

## 2023-05-02 ENCOUNTER — Telehealth: Payer: Self-pay | Admitting: Cardiology

## 2023-05-02 MED ORDER — FUROSEMIDE 20 MG PO TABS: 10.0000 mg | ORAL_TABLET | ORAL | 1 refills | Status: DC | PRN

## 2023-05-02 NOTE — Telephone Encounter (Signed)
 Patient returned call. DOB verified.  Patient's concerns:  - LLE and ankle swollen right after patient get out of bed and moves around; pt states, "My ankle and legs look fine when I first wake up because I have been off of them for a while." - LLE swollen since 3/7.  - Confirmed patient has been wearing compression stockings, feet are elevated, cool compress initiated when on ABT but PCP advised to stop. Patient stated, "My primary doctor said they don't look hot or infected anymore."  - Per pt, PCP started ABT for cellulitis. Course completed. [place] to LLE done: Negative for DVT.  - Pt states she has been taking all her medications as prescribed.  - When reviewing chart, during neuro appt in 12/24, PRN Lasix (Dx: severe swelling) was discontinued per pt preference.  - Pt denies CP, SOB, lightheadedness, headaches and dizziness.  Nurse's Recommendations:  - Encouraged patient to continue wearing compression stockings and to keep legs elevated to help swelling.  - Will review with DOD and call back with recommendations.  Patient verbalized understanding.

## 2023-05-02 NOTE — Telephone Encounter (Signed)
 Left message for patient to call back

## 2023-05-02 NOTE — Telephone Encounter (Signed)
 Pt c/o swelling/edema: STAT if pt has developed SOB within 24 hours  If swelling, where is the swelling located? Left ankle and leg swelling  How much weight have you gained and in what time span? No weight gain  Have you gained 2 pounds in a day or 5 pounds in a week? no  Do you have a log of your daily weights (if so, list)? --  Are you currently taking a fluid pill? no  Are you currently SOB? no  Have you traveled recently in a car or plane for an extended period of time? No  Patient states she saw her PCP put her on an antibiotic for cellulitis, she finished the course of antibiotics. Ankle and leg is still swollen.  Her PCP sent her for an ultrasound of her leg to r/o DVT and that came back negative.  He advised her to get in touch with her cardiologist.  She states she has kept leg elevated and is swearing compression socks.

## 2023-05-02 NOTE — Telephone Encounter (Signed)
 Called and spoke to patient. DOB verified.  Nurse's Recommendations:  - Per DOD, will restart Lasix 10 mg as needed for swelling. Medication has been sent to local pharmacy.  Patient verbalized understanding.

## 2023-05-10 ENCOUNTER — Telehealth: Payer: Self-pay | Admitting: Cardiology

## 2023-05-10 DIAGNOSIS — R600 Localized edema: Secondary | ICD-10-CM

## 2023-05-10 NOTE — Telephone Encounter (Signed)
 Returned call to patient,   Patient states that she was seeing a doctor for cellulitis. Infection was resolved, but leg is still swollen. Has been taking lasix half tablet twice daily since 3/20 with little change in the swelling.   Advised will have information reviewed and call back with recommendations

## 2023-05-10 NOTE — Telephone Encounter (Signed)
 Returned call to patient, reviewed recommendations. Pt scheduled with E. Monge 4/7 at 1030am. Labs ordered for repeat Monday/Tuesday

## 2023-05-10 NOTE — Telephone Encounter (Signed)
 Patient called and wanted Dr. Cristal Deer to know that her ankle is still swelling due to cellulitis and want to discuss it.

## 2023-05-10 NOTE — Telephone Encounter (Signed)
 May take Lasix 20mg  daily x 2 days. Needs to get BMET/BNP either Mon or Tues of next week to ensure no significant volume overload and kidney function/electrolytes stable. Edema previously felt to be venous insufficiency, encouraged leg elevation and compression stockings. Recommend follow up OV with Dr. Cristal Deer or APP within next 2-3 weeks to reassess edema.   Alver Sorrow, NP

## 2023-05-15 LAB — BASIC METABOLIC PANEL WITH GFR
BUN/Creatinine Ratio: 19 (ref 12–28)
BUN: 30 mg/dL — ABNORMAL HIGH (ref 8–27)
CO2: 28 mmol/L (ref 20–29)
Calcium: 9.4 mg/dL (ref 8.7–10.3)
Chloride: 97 mmol/L (ref 96–106)
Creatinine, Ser: 1.55 mg/dL — ABNORMAL HIGH (ref 0.57–1.00)
Glucose: 137 mg/dL — ABNORMAL HIGH (ref 70–99)
Potassium: 3.9 mmol/L (ref 3.5–5.2)
Sodium: 141 mmol/L (ref 134–144)
eGFR: 34 mL/min/{1.73_m2} — ABNORMAL LOW (ref >59)

## 2023-05-15 LAB — BRAIN NATRIURETIC PEPTIDE: BNP: 306.2 pg/mL — ABNORMAL HIGH (ref 0.0–100.0)

## 2023-05-16 ENCOUNTER — Encounter (HOSPITAL_BASED_OUTPATIENT_CLINIC_OR_DEPARTMENT_OTHER): Payer: Self-pay

## 2023-05-16 MED ORDER — FUROSEMIDE 20 MG PO TABS: 20.0000 mg | ORAL_TABLET | ORAL | 1 refills | Status: DC

## 2023-05-16 NOTE — Telephone Encounter (Addendum)
 Results called to patient who verbalizes understanding! Refill sent to pharmacy at patient request.    ----- Message from Alver Sorrow sent at 05/16/2023 12:26 PM EDT ----- BMP shows volume overload.  Kidney function decreased from prior.  Normal electrolytes. Recommend adjust Lasix to 20mg  every other day for protection of renal function but also to allow for diuresis. Continue leg elevation, compression stockings, low sodium diet. Recommend follow-up as scheduled for/11/25 with Bernadene Person, NP  Delorse Lek this lady called with increased edema despite Furosemide 10mg , increased to 20mg  for a few days, updated labs. Sees you 05/20/23.   Prior labs for review only: 11/26/22 BNP 291.2  08/2022 GFR 51, 1.12

## 2023-05-20 ENCOUNTER — Ambulatory Visit: Attending: Nurse Practitioner | Admitting: Nurse Practitioner

## 2023-05-20 ENCOUNTER — Encounter: Payer: Self-pay | Admitting: Nurse Practitioner

## 2023-05-20 VITALS — BP 138/88 | HR 87 | Ht 67.0 in | Wt 186.3 lb

## 2023-05-20 DIAGNOSIS — I872 Venous insufficiency (chronic) (peripheral): Secondary | ICD-10-CM | POA: Diagnosis present

## 2023-05-20 DIAGNOSIS — I4811 Longstanding persistent atrial fibrillation: Secondary | ICD-10-CM | POA: Diagnosis present

## 2023-05-20 DIAGNOSIS — I10 Essential (primary) hypertension: Secondary | ICD-10-CM | POA: Insufficient documentation

## 2023-05-20 DIAGNOSIS — R600 Localized edema: Secondary | ICD-10-CM | POA: Diagnosis not present

## 2023-05-20 DIAGNOSIS — Z8673 Personal history of transient ischemic attack (TIA), and cerebral infarction without residual deficits: Secondary | ICD-10-CM | POA: Insufficient documentation

## 2023-05-20 DIAGNOSIS — Z87898 Personal history of other specified conditions: Secondary | ICD-10-CM | POA: Insufficient documentation

## 2023-05-20 DIAGNOSIS — E785 Hyperlipidemia, unspecified: Secondary | ICD-10-CM | POA: Diagnosis present

## 2023-05-20 NOTE — Progress Notes (Signed)
 Office Visit    Patient Name: Rebecca Houston Date of Encounter: 05/20/2023  Primary Care Provider:  Aniceto Boss, MD Primary Cardiologist:  Jodelle Red, MD  Chief Complaint    78 year old female with a history of persistent atrial fibrillation, syncope, bilateral lower extremity edema, hypertension, hyperlipidemia, and CVA who presents for follow-up related to lower extremity edema.  Past Medical History    Past Medical History:  Diagnosis Date   A-fib [facility])    Anxiety    Arthritis    Basal cell carcinoma    Brain tumor (benign) (HCC)    Bruises easily    Cancer (HCC)    melanoma on neck   Depression    Heartburn    History of kidney stones    Hypertension    ILD (interstitial lung disease) (HCC) 01/2018   Early   Lumbar foraminal stenosis    L5-S1   Melanoma (HCC)    PONV (postoperative nausea and vomiting)    Psoriasis    Pulmonary nodules    Stable bilateral sub-6 mm pulmonary nodules    Renal cyst    Past Surgical History:  Procedure Laterality Date   ABDOMINAL EXPOSURE N/A 04/02/2016   Procedure: ABDOMINAL EXPOSURE;  Surgeon: Larina Earthly, MD;  Location: [facility] OR;  Service: Vascular;  Laterality: N/A;   ABDOMINAL HYSTERECTOMY     ANTERIOR CERVICAL DECOMP/DISCECTOMY FUSION N/A 04/16/2017   Procedure: Cervical Four-Five, Cervical Five-Six, Cervical Six-Seven  Anterior cervical decompression/discectomy, fusion;  Surgeon: Maeola Harman, MD;  Location: [facility] OR;  Service: Neurosurgery;  Laterality: N/A;  C4-5 C5-6 C6-7 Anterior cervical decompression/discectomy, fusion   ANTERIOR LUMBAR FUSION N/A 04/02/2016   Procedure: Lumbar five-Sacral one  Anterior lumbar interbody fusion with Dr. Gretta Began;  Surgeon: Maeola Harman, MD;  Location: [facility] OR;  Service: Neurosurgery;  Laterality: N/A;  L5-S1 Anterior lumbar interbody fusion with Dr. Tawanna Cooler Early   BRAIN SURGERY     BREAST REDUCTION SURGERY     CATARACT EXTRACTION, BILATERAL     CHOLECYSTECTOMY N/A 09/04/2018    Procedure: LAPAROSCOPIC CHOLECYSTECTOMY WITH INTRAOPERATIVE CHOLANGIOGRAM;  Surgeon: Darnell Level, MD;  Location: [facility] ORS;  Service: General;  Laterality: N/A;   COLONOSCOPY     GALLBLADDER SURGERY  0723/2020   NECK SURGERY     bone spur    Allergies  Allergies  Allergen Reactions   No Known Allergies      Labs/Other Studies Reviewed    The following studies were reviewed today:  Cardiac Studies & Procedures   ______________________________________________________________________________________________     ECHOCARDIOGRAM  ECHOCARDIOGRAM COMPLETE 11/02/2022  Narrative ECHOCARDIOGRAM REPORT    Patient Name:   Rebecca Houston Date of Exam: 11/02/2022 Medical Rec #:  [id number]     Height:       67.0 in Accession #:    [id number]    Weight:       193.0 lb Date of Birth:  04/04/1945     BSA:          1.992 m Patient Age:    76 years      BP:           125/86 mmHg Patient Gender: F             HR:           88 bpm. Exam Location:  Outpatient  Procedure: 2D Echo, 3D Echo, Cardiac Doppler, Color Doppler and Strain Analysis  Indications:    Atrial Fibrillation  History:  Patient has prior history of Echocardiogram examinations, most recent 02/23/2021. Stroke, Arrythmias:Atrial Fibrillation; Risk Factors:Hypertension and Former Smoker.  Sonographer:    Jeryl Columbia RDCS Referring Phys: [id number] CAITLIN S WALKER  IMPRESSIONS   1. Left ventricular ejection fraction, by estimation, is 50 to 55%. The left ventricle has low normal function. The left ventricle has no regional wall motion abnormalities. Left ventricular diastolic function could not be evaluated. 2. Right ventricular systolic function is low normal. The right ventricular size is normal. There is normal pulmonary artery systolic pressure. The estimated right ventricular systolic pressure is 25.8 mmHg. 3. The mitral valve is grossly normal. Mild mitral valve regurgitation. No evidence of mitral stenosis. 4.  The aortic valve is tricuspid. There is mild calcification of the aortic valve. Aortic valve regurgitation is not visualized. Aortic valve sclerosis is present, with no evidence of aortic valve stenosis. 5. The inferior vena cava is normal in size with greater than 50% respiratory variability, suggesting right atrial pressure of 3 mmHg.  Comparison(s): Changes from prior study are noted. LVEF now 50-55%.  FINDINGS Left Ventricle: Left ventricular ejection fraction, by estimation, is 50 to 55%. The left ventricle has low normal function. The left ventricle has no regional wall motion abnormalities. The left ventricular internal cavity size was normal in size. There is no left ventricular hypertrophy. Left ventricular diastolic function could not be evaluated due to atrial fibrillation. Left ventricular diastolic function could not be evaluated.  Right Ventricle: The right ventricular size is normal. No increase in right ventricular wall thickness. Right ventricular systolic function is low normal. There is normal pulmonary artery systolic pressure. The tricuspid regurgitant velocity is 2.39 m/s, and with an assumed right atrial pressure of 3 mmHg, the estimated right ventricular systolic pressure is 25.8 mmHg.  Left Atrium: Left atrial size was normal in size.  Right Atrium: Right atrial size was normal in size.  Pericardium: There is no evidence of pericardial effusion.  Mitral Valve: The mitral valve is grossly normal. Mild mitral valve regurgitation. No evidence of mitral valve stenosis.  Tricuspid Valve: The tricuspid valve is grossly normal. Tricuspid valve regurgitation is mild . No evidence of tricuspid stenosis.  Aortic Valve: The aortic valve is tricuspid. There is mild calcification of the aortic valve. Aortic valve regurgitation is not visualized. Aortic valve sclerosis is present, with no evidence of aortic valve stenosis.  Pulmonic Valve: The pulmonic valve was grossly normal.  Pulmonic valve regurgitation is trivial. No evidence of pulmonic stenosis.  Aorta: The aortic root and ascending aorta are structurally normal, with no evidence of dilitation.  Venous: The inferior vena cava is normal in size with greater than 50% respiratory variability, suggesting right atrial pressure of 3 mmHg.  IAS/Shunts: The atrial septum is grossly normal.   LEFT VENTRICLE PLAX 2D LVIDd:         4.65 cm   Diastology LVIDs:         3.22 cm   LV e' medial:    10.20 cm/s LV PW:         0.92 cm   LV E/e' medial:  6.2 LV IVS:        0.71 cm   LV e' lateral:   11.30 cm/s LVOT diam:     2.10 cm   LV E/e' lateral: 5.6 LV SV:         51 LV SV Index:   26 LVOT Area:     3.46 cm  3D Volume EF: 3D EF:  51 % LV EDV:       83 ml LV ESV:       41 ml LV SV:        42 ml  RIGHT VENTRICLE RV Basal diam:  3.61 cm RV Mid diam:    2.65 cm RV S prime:     8.81 cm/s TAPSE (M-mode): 1.5 cm  LEFT ATRIUM             Index        RIGHT ATRIUM           Index LA diam:        4.40 cm 2.21 cm/m   RA Area:     15.30 cm LA Vol (A2C):   95.6 ml 48.00 ml/m  RA Volume:   40.90 ml  20.53 ml/m LA Vol (A4C):   66.0 ml 33.13 ml/m LA Biplane Vol: 79.7 ml 40.01 ml/m AORTIC VALVE LVOT Vmax:   78.50 cm/s LVOT Vmean:  54.400 cm/s LVOT VTI:    0.147 m  AORTA Ao Root diam: 3.00 cm Ao Asc diam:  3.00 cm  MITRAL VALVE               TRICUSPID VALVE MV Area (PHT): 2.37 cm    TR Peak grad:   22.8 mmHg MV Decel Time: 320 msec    TR Vmax:        239.00 cm/s MR Peak grad: 34.1 mmHg MR Vmax:      292.00 cm/s  SHUNTS MV E velocity: 63.20 cm/s  Systemic VTI:  0.15 m Systemic Diam: 2.10 cm  Lennie Odor MD Electronically signed by Lennie Odor MD Signature Date/Time: 11/02/2022/11:59:59 AM    Final          ______________________________________________________________________________________________     Recent Labs: 05/14/2023: BNP 306.2; BUN 30; Creatinine, Ser 1.55; Potassium 3.9;  Sodium 141  Recent Lipid Panel    Component Value Date/Time   CHOL 162 04/06/2020 1216   TRIG 222 (H) 04/06/2020 1216   HDL 33 (L) 04/06/2020 1216   CHOLHDL 4.9 (H) 04/06/2020 1216   LDLCALC 91 04/06/2020 1216    History of Present Illness    78 year old female with the above past medical history including persistent atrial fibrillation, syncope, bilateral lower extremity edema, hypertension, hyperlipidemia, and CVA.  She was initially evaluated by cardiology in 01/2019 for evaluation and management of atrial fibrillation.  She has a history of dizziness, presyncope/syncope, improved with decreased metoprolol dosing. Most recent echocardiogram in 10/2022 showed EF 50 to 55%, low normal LV function, no RWMA, low normal LV systolic function, mild mitral valve regurgitation, mild calcification of the aortic valve, no evidence of aortic valve stenosis. She was last seen in the office on 11/27/2022 and reported increased bilateral lower extremity edema, skin discoloration of her bilateral lower extremities. Symptoms were felt to be consistent with venous insufficiency. Compression, elevation were encouraged.  She contacted our office in March 2025 with concern for increased bilateral lower extremity edema. She was advised to take Lasix 20 mg every other day.  She presents today for follow-up accompanied by her husband. Since her last visit she has been stable overall from a cardiac standpoint. She has continued to struggle with left lower extremity edema despite increased Lasix dosing.  She was treated for cellulitis. LE duplex per her orthopedist was negative for DVT. She reports adherence to her Xarelto. Swelling is largely dependent, improves with elevation, she has been wearing compression stockings.  She also notes weakness  to her legs bilaterally.  She has been participating in PT.  Home Medications    Current Outpatient Medications  Medication Sig Dispense Refill   amLODipine (NORVASC) 5 MG  tablet TAKE 1 TABLET BY MOUTH ONCE DAILY. 30 tablet 2   busPIRone (BUSPAR) 15 MG tablet Take 15 mg by mouth 3 (three) times daily.     escitalopram (LEXAPRO) 20 MG tablet Take 20 mg by mouth every evening.     furosemide (LASIX) 20 MG tablet Take 1 tablet (20 mg total) by mouth every other day. 90 tablet 1   metoprolol succinate (TOPROL XL) 25 MG 24 hr tablet Take 1 tablet (25 mg total) by mouth daily. 30 tablet 11   rivaroxaban (XARELTO) 20 MG TABS tablet Take 20 mg by mouth daily with supper.     ALPRAZolam (XANAX) 0.5 MG tablet Take 0.5 mg by mouth 2 (two) times daily as needed for anxiety. (Patient not taking: Reported on 05/20/2023)     buPROPion (WELLBUTRIN XL) 150 MG 24 hr tablet Take 150 mg by mouth daily. (Patient not taking: Reported on 05/20/2023)     lisinopril (PRINIVIL,ZESTRIL) 20 MG tablet Take 20 mg by mouth daily. (Patient not taking: Reported on 05/20/2023)     Potassium Chloride ER 20 MEQ TBCR Take 1 tablet by mouth daily. (Patient not taking: Reported on 05/20/2023)     No current facility-administered medications for this visit.     Review of Systems    She denies chest pain, palpitations, dyspnea, pnd, orthopnea, n, v, dizziness, syncope, weight gain, or early satiety. All other systems reviewed and are otherwise negative except as noted above.   Physical Exam    VS:  BP 138/88 (BP Location: Left Arm, Patient Position: Sitting, Cuff Size: Normal)   Pulse 87   Ht 5\' 7"  (1.702 m)   Wt 186 lb 4.8 oz (84.5 kg)   SpO2 100%   BMI 29.18 kg/m   GEN: Well nourished, well developed, in no acute distress. HEENT: normal. Neck: Supple, no JVD, carotid bruits, or masses. Cardiac: RRR, no murmurs, rubs, or gallops. No clubbing, cyanosis, nonpitting bilateral lower extremity edema, L>R.  Radials/DP/PT 2+ and equal bilaterally.  Respiratory:  Respirations regular and unlabored, clear to auscultation bilaterally. GI: Soft, nontender, nondistended, BS + x 4. MS: no deformity or  atrophy. Skin: warm and dry, no rash. Neuro:  Strength and sensation are intact. Psych: Normal affect.  Accessory Clinical Findings    ECG personally reviewed by me today - EKG Interpretation Date/Time:  Monday May 20 2023 10:13:53 EDT Ventricular Rate:  87 PR Interval:    QRS Duration:  80 QT Interval:  410 QTC Calculation: 493 R Axis:   51  Text Interpretation: Atrial fibrillation Prolonged QT When compared with ECG of 27-Nov-2022 09:47, No significant change was found Confirmed by Bernadene Person ([id number]) on 05/20/2023 10:15:16 AM  - no acute changes.   Lab Results  Component Value Date   WBC 8.4 09/03/2018   HGB 12.3 09/04/2018   HCT 37.2 09/04/2018   MCV 89.9 09/03/2018   PLT 193 09/03/2018   Lab Results  Component Value Date   CREATININE 1.55 (H) 05/14/2023   BUN 30 (H) 05/14/2023   NA 141 05/14/2023   K 3.9 05/14/2023   CL 97 05/14/2023   CO2 28 05/14/2023   No results found for: "ALT", "AST", "GGT", "ALKPHOS", "BILITOT" Lab Results  Component Value Date   CHOL 162 04/06/2020   HDL 33 (L) 04/06/2020  LDLCALC 91 04/06/2020   TRIG 222 (H) 04/06/2020   CHOLHDL 4.9 (H) 04/06/2020    Lab Results  Component Value Date   HGBA1C 6.6 (H) 04/06/2020    Assessment & Plan   1. Bilateral lower extremity edema: Most recent echocardiogram in 10/2022 showed EF 50 to 55%, low normal LV function, no RWMA, low normal LV systolic function, mild mitral valve regurgitation, mild calcification of the aortic valve, no evidence of aortic valve stenosis.  She was recently treated for left lower extremity cellulitis, lower extremity duplex was negative for DVT.  She reports adherence to Xarelto.  She continues to note significant left lower extremity edema despite increased Lasix dosing.  Her swelling is largely dependent, improves with elevation.  She has been wearing compression stockings. She denies any chest pain, dyspnea, palpitations, PND, orthopnea, weight gain.  Given elevated BNP,  ongoing lower extreme edema, will increase Lasix to 40 mg daily x 3 days followed by Lasix 20 mg daily.  Will check BMET, BNP today, will repeat BMET in 1 week.  Will also repeat venous reflux study.  If she has evidence of significant venous reflux, would likely benefit from referral to vascular. Of notes, she is on amlodipine, which could be contributing to her swelling. Consider alternative antihypertensive therapy if symptoms persist despite increased Lasix and if no evidence of significant venous reflux.   2. Persistent atrial fibrillation: Generally asymptomatic. Continue metoprolol, Xarelto.  3. History of syncope: Resolved with decreased metoprolol dosing.  No recurrence.  4. Hypertension: BP well controlled. Continue current antihypertensive regimen.   5. Hyperlipidemia: LDL was 104 in 08/2022. Monitored and managed per PCP.   6. History of CVA: Continue Xarelto.   7. CKD stage IIIb: Creatinine was 1.55 on 05/14/2023. Will repeat BMET today and in 1 week in the setting of increased Lasix dosing.   8. Disposition: Follow-up in 2 weeks with APP at [facility].      Joylene Grapes, NP 05/20/2023, 10:15 AM

## 2023-05-20 NOTE — Patient Instructions (Addendum)
 Medication Instructions:  Lasix 40 mg daily for 3 days, then 20 mg daily  *If you need a refill on your cardiac medications before your next appointment, please call your pharmacy*  Lab Work: BMET & BNP today, BMET in 1 week  Testing/Procedures: Your physician has requested that you have a lower  venous reflux. This test is an ultrasound of the veins in the legs or arms. It looks at venous blood flow that carries blood from the heart to the legs or arms. Allow one hour for a Lower Venous exam. Allow thirty minutes for an Upper Venous exam. There are no restrictions or special instructions.  Please note: We ask at that you not bring children with you during ultrasound (echo/ vascular) testing. Due to room size and safety concerns, children are not allowed in the ultrasound rooms during exams. Our front office staff cannot provide observation of children in our lobby area while testing is being conducted. An adult accompanying a patient to their appointment will only be allowed in the ultrasound room at the discretion of the ultrasound technician under special circumstances. We apologize for any inconvenience.   Follow-Up: At [facility], you and your health needs are our priority.  As part of our continuing mission to provide you with exceptional heart care, our providers are all part of one team.  This team includes your primary Cardiologist (physician) and Advanced Practice Providers or APPs (Physician Assistants and Nurse Practitioners) who all work together to provide you with the care you need, when you need it.  Your next appointment:   2 week(s)  Provider:   Gillian Shields, NP    We recommend signing up for the patient portal called "MyChart".  Sign up information is provided on this After Visit Summary.  MyChart is used to connect with patients for Virtual Visits (Telemedicine).  Patients are able to view lab/test results, encounter notes, upcoming appointments, etc.   Non-urgent messages can be sent to your provider as well.   To learn more about what you can do with MyChart, go to [email or web address].   Other Instructions       1st Floor: - Lobby - Registration  - Pharmacy  - Lab - Cafe  2nd Floor: - PV Lab - Diagnostic Testing (echo, CT, nuclear med)  3rd Floor: - Vacant  4th Floor: - TCTS (cardiothoracic surgery) - AFib Clinic - Structural Heart Clinic - Vascular Surgery  - Vascular Ultrasound  5th Floor: - HeartCare Cardiology (general and EP) - Clinical Pharmacy for coumadin, hypertension, lipid, weight-loss medications, and med management appointments    Valet parking services will be available as well.

## 2023-05-22 ENCOUNTER — Ambulatory Visit: Admitting: Nurse Practitioner

## 2023-05-22 LAB — BASIC METABOLIC PANEL WITH GFR
BUN/Creatinine Ratio: 22 (ref 12–28)
BUN: 24 mg/dL (ref 8–27)
CO2: 24 mmol/L (ref 20–29)
Calcium: 9.1 mg/dL (ref 8.7–10.3)
Chloride: 99 mmol/L (ref 96–106)
Creatinine, Ser: 1.09 mg/dL — ABNORMAL HIGH (ref 0.57–1.00)
Glucose: 106 mg/dL — ABNORMAL HIGH (ref 70–99)
Potassium: 3.7 mmol/L (ref 3.5–5.2)
Sodium: 140 mmol/L (ref 134–144)
eGFR: 52 mL/min/{1.73_m2} — ABNORMAL LOW (ref >59)

## 2023-05-22 LAB — BRAIN NATRIURETIC PEPTIDE: BNP: 291.4 pg/mL — ABNORMAL HIGH (ref 0.0–100.0)

## 2023-05-23 ENCOUNTER — Ambulatory Visit (HOSPITAL_BASED_OUTPATIENT_CLINIC_OR_DEPARTMENT_OTHER)

## 2023-05-23 DIAGNOSIS — Z87898 Personal history of other specified conditions: Secondary | ICD-10-CM

## 2023-05-23 DIAGNOSIS — I10 Essential (primary) hypertension: Secondary | ICD-10-CM

## 2023-05-23 DIAGNOSIS — R600 Localized edema: Secondary | ICD-10-CM | POA: Diagnosis not present

## 2023-05-23 DIAGNOSIS — Z8673 Personal history of transient ischemic attack (TIA), and cerebral infarction without residual deficits: Secondary | ICD-10-CM

## 2023-05-23 DIAGNOSIS — I4811 Longstanding persistent atrial fibrillation: Secondary | ICD-10-CM

## 2023-05-23 DIAGNOSIS — I872 Venous insufficiency (chronic) (peripheral): Secondary | ICD-10-CM

## 2023-05-23 DIAGNOSIS — E785 Hyperlipidemia, unspecified: Secondary | ICD-10-CM

## 2023-05-24 ENCOUNTER — Telehealth: Payer: Self-pay

## 2023-05-24 NOTE — Telephone Encounter (Signed)
 Pts daughter returned call and was notified of lab results. Left a message with pts machine to discuss labs with pt per daughters request. Waiting for a return call.

## 2023-05-24 NOTE — Telephone Encounter (Signed)
 Pt returned call and was notified of lab results. Pt will repeat lab work and f/u as planned.

## 2023-05-24 NOTE — Telephone Encounter (Signed)
 Lmom to discuss lab results. Lab results sent via mychart as well. Pt advised to call back if needed.

## 2023-05-29 LAB — BASIC METABOLIC PANEL WITH GFR
BUN/Creatinine Ratio: 19 (ref 12–28)
BUN: 25 mg/dL (ref 8–27)
CO2: 23 mmol/L (ref 20–29)
Calcium: 8.9 mg/dL (ref 8.7–10.3)
Chloride: 98 mmol/L (ref 96–106)
Creatinine, Ser: 1.32 mg/dL — ABNORMAL HIGH (ref 0.57–1.00)
Glucose: 84 mg/dL (ref 70–99)
Potassium: 3.2 mmol/L — ABNORMAL LOW (ref 3.5–5.2)
Sodium: 140 mmol/L (ref 134–144)
eGFR: 42 mL/min/{1.73_m2} — ABNORMAL LOW (ref >59)

## 2023-06-04 ENCOUNTER — Ambulatory Visit (HOSPITAL_BASED_OUTPATIENT_CLINIC_OR_DEPARTMENT_OTHER): Admitting: Family

## 2023-06-04 ENCOUNTER — Encounter (HOSPITAL_BASED_OUTPATIENT_CLINIC_OR_DEPARTMENT_OTHER): Payer: Self-pay | Admitting: Family

## 2023-06-04 VITALS — BP 120/74 | HR 80 | Ht 67.0 in | Wt 190.2 lb

## 2023-06-04 DIAGNOSIS — I10 Essential (primary) hypertension: Secondary | ICD-10-CM

## 2023-06-04 DIAGNOSIS — D6859 Other primary thrombophilia: Secondary | ICD-10-CM

## 2023-06-04 DIAGNOSIS — I4811 Longstanding persistent atrial fibrillation: Secondary | ICD-10-CM | POA: Diagnosis not present

## 2023-06-04 DIAGNOSIS — I872 Venous insufficiency (chronic) (peripheral): Secondary | ICD-10-CM | POA: Diagnosis not present

## 2023-06-04 DIAGNOSIS — E876 Hypokalemia: Secondary | ICD-10-CM | POA: Diagnosis not present

## 2023-06-04 LAB — BASIC METABOLIC PANEL WITH GFR
BUN/Creatinine Ratio: 23 (ref 12–28)
BUN: 24 mg/dL (ref 8–27)
CO2: 25 mmol/L (ref 20–29)
Calcium: 9.1 mg/dL (ref 8.7–10.3)
Chloride: 100 mmol/L (ref 96–106)
Creatinine, Ser: 1.03 mg/dL — ABNORMAL HIGH (ref 0.57–1.00)
Glucose: 104 mg/dL — ABNORMAL HIGH (ref 70–99)
Potassium: 3.5 mmol/L (ref 3.5–5.2)
Sodium: 140 mmol/L (ref 134–144)
eGFR: 56 mL/min/{1.73_m2} — ABNORMAL LOW (ref >59)

## 2023-06-04 MED ORDER — AMLODIPINE BESYLATE 2.5 MG PO TABS: 2.5000 mg | ORAL_TABLET | Freq: Every day | ORAL | 2 refills | Status: AC

## 2023-06-04 NOTE — Patient Instructions (Signed)
 Medication Instructions:  Your physician has recommended you make the following change in your medication:   Stop Lisinopril    Change Amlodipine  2.5mg  daily   *If you need a refill on your cardiac medications before your next appointment, please call your pharmacy*  Lab Work: Labs today- BMP   Follow-Up: At [facility], you and your health needs are our priority.  As part of our continuing mission to provide you with exceptional heart care, our providers are all part of one team.  This team includes your primary Cardiologist (physician) and Advanced Practice Providers or APPs (Physician Assistants and Nurse Practitioners) who all work together to provide you with the care you need, when you need it.  Please follow up in 6 months with Dr. Veryl Gottron, Slater Duncan, NP or Neomi Banks, NP   We recommend signing up for the patient portal called "MyChart".  Sign up information is provided on this After Visit Summary.  MyChart is used to connect with patients for Virtual Visits (Telemedicine).  Patients are able to view lab/test results, encounter notes, upcoming appointments, etc.  Non-urgent messages can be sent to your provider as well.   To learn more about what you can do with MyChart, go to [email or web address].   Other Instructions We have referred you to Vascular Surgery for Venous Insuffiencey

## 2023-06-04 NOTE — Progress Notes (Signed)
 Cardiology Office Note:  .   Date:  06/04/2023  ID:  Rebecca Houston, DOB 10-May-1945, MRN [id number] PCP: Wilder Handy, MD  [facility] Providers Cardiologist:  Sheryle Donning, MD    History of Present Illness: .   Rebecca Houston is a 78 y.o. female PAF, HTN, fatigue, UTI, anxiety, depression  She was seen by PCP for weakness in her legs and persistent malaise.  She noted occasional episodes of palpitations.  ZIO monitor 08/2022 with 100% burden of atrial fibrillation with average of 85 bpm, 2 runs of VT longest 15 beats at rate of 135 bpm and fastest 4 beats with max rate of 143 bpm.  These were not triggered episodes.  Both occurred during waking hours.  During daytime hours her atrial fibrillation was controlled 90% of the time with heart rate between 110-150 bpm 9% of the time.  Triggered events atrial fibrillation 141 bpm.  She saw Marlana Silvan 05/20/23 with persistent left lower extremity edema despite increased Lasix  dose.  Had completed course of antibiotics for cellulitis.  LE duplex per her orthopedist was negative for DVT.  Swelling improves on elevation.  She was wearing compression stockings.  Lasix  increased to 40 mg daily x 3 days then followed by 20 mg daily.  Repeat venous reflux study  05/23/2023 showed evidence of venous reflux in LLE.  Labs 05/28/2023 creatinine 1.32, K3.2, BNP 291.4.  She was recommended to resume potassium 20 mill equivalent daily.  Presents today for follow-up with her husband.  Reviewed venous reflux study.  She reports persistent lower extremity edema worse by end of day.  She does elevate her leg and try to wear compression stockings but still with bothersome edema by end of day.  Reports left lower extremity pain which she attributes to bone spur.  Notes it also feels "tight "related to her swelling.  She has not taken lisinopril  in a number of months - self discontinued due to wish to be on fewer medications.  Has not been checking BP routinely at  home.  ROS: Please see the history of present illness.    All other systems reviewed and are negative.   Studies Reviewed: .           Risk Assessment/Calculations:    CHA2DS2-VASc Score = 4   This indicates a 4.8% annual risk of stroke. The patient's score is based upon: CHF History: 0 HTN History: 1 Diabetes History: 0 Stroke History: 0 Vascular Disease History: 0 Age Score: 2 Gender Score: 1           Physical Exam:   VS:  BP 120/74   Pulse 80   Ht 5\' 7"  (1.702 m)   Wt 190 lb 3.2 oz (86.3 kg)   SpO2 99%   BMI 29.79 kg/m    Wt Readings from Last 3 Encounters:  06/04/23 190 lb 3.2 oz (86.3 kg)  05/20/23 186 lb 4.8 oz (84.5 kg)  01/21/23 185 lb (83.9 kg)    GEN: Well nourished, well developed in no acute distress NECK: No JVD; No carotid bruits CARDIAC: IRIR, no murmurs, rubs, gallops RESPIRATORY:  Clear to auscultation without rales, wheezing or rhonchi  ABDOMEN: Soft, non-tender, non-distended EXTREMITIES: LLE nonpitting edema; No deformity   ASSESSMENT AND PLAN: .     Persistent atrial fibrillation / Hypercoagulable state -rate controlled by auscultation today.  Continue rate control.. Continue Toprol  25mg  daily, Xarelto  20mg  daily. CHA2DS2-VASc Score = 4 [CHF History: 0, HTN History: 1, Diabetes History: 0,  Stroke History: 0, Vascular Disease History: 0, Age Score: 2, Gender Score: 1].  Therefore, the patient's annual risk of stroke is 4.8 %.    His bleeding complications.  LE edema / Venous insufficiency -LLE with nonpitting edema.  This has remained unchanged despite diuretics, lower extremity elevation, compression stocking.  Venous duplex 06/02/2023 with venous reflux noted in the left short saphenous vein.  Will refer to [facility] for further management.  HTN -BP goal less than 130/80.  Has not taken lisinopril  in quite a few months-will remove her medication list.  Did her desire to be on fewer medications will reduce amlodipine  from 5 mg to 2.5 mg daily.  Discussed to monitor BP at home at least 2 hours after medications and sitting for 5-10 minutes.  She will contact us  if BP persistently greater than 130/80, if so plan to increase dose.  If BP remains less than 130/80 can consider trial off amlodipine  at future follow-up visit.  CKD IIIb - Careful titration of diuretic and antihypertensive.    Hypokalemia - update BMET. Continue potassium 20mEq daily.       Dispo: follow up in 6 months  Signed, Clearnce Curia, NP

## 2023-06-05 ENCOUNTER — Encounter (HOSPITAL_BASED_OUTPATIENT_CLINIC_OR_DEPARTMENT_OTHER): Payer: Self-pay

## 2023-06-10 ENCOUNTER — Telehealth: Payer: Self-pay | Admitting: Cardiology

## 2023-06-10 NOTE — Telephone Encounter (Signed)
 Pt's PCP called requesting most recent lab results be sent to their office. Please advise

## 2023-06-10 NOTE — Telephone Encounter (Signed)
 Most recent lab sent to PCP office.

## 2023-06-14 ENCOUNTER — Ambulatory Visit: Attending: Vascular Surgery | Admitting: Physician Assistant

## 2023-06-14 VITALS — BP 125/82 | HR 79 | Temp 97.4°F | Wt 189.9 lb

## 2023-06-14 DIAGNOSIS — I890 Lymphedema, not elsewhere classified: Secondary | ICD-10-CM | POA: Insufficient documentation

## 2023-06-14 DIAGNOSIS — I872 Venous insufficiency (chronic) (peripheral): Secondary | ICD-10-CM | POA: Diagnosis not present

## 2023-06-14 DIAGNOSIS — M7989 Other specified soft tissue disorders: Secondary | ICD-10-CM

## 2023-06-14 NOTE — Progress Notes (Signed)
 Requested by:  Clearnce Curia, NP [address] [place],  [place] [address]  Reason for consultation: left leg swelling    History of Present Illness   Rebecca Houston is a 78 y.o. (09-Feb-1946) female who presents for evaluation of leg swelling. She says that she has had bilateral lower leg swelling for about 5 years, left worse than right. Over time, her left leg swelling has gotten worse.  Her swelling has also progressed into the foot over time.  She says in February she had an episode of cellulitis in the left leg, and her left leg swelling has not improved since then.  This causes her leg to feel achy, heavy, and uncomfortable.  She also endorses skin changes to her legs for the past several years.  Overall her right leg swelling is not very bothersome.  She has tried to wear compression stockings and elevate her legs above her heart.  This has helped with her symptoms mildly, however her left leg still gets very swollen by the end of the day.  She has also been placed on diuretics without benefit.  She has no prior history of DVT or vein procedures.   Past Medical History:  Diagnosis Date   A-fib [facility])    Anxiety    Arthritis    Basal cell carcinoma    Brain tumor (benign) (HCC)    Bruises easily    Cancer (HCC)    melanoma on neck   Depression    Heartburn    History of kidney stones    Hypertension    ILD (interstitial lung disease) (HCC) 01/2018   Early   Lumbar foraminal stenosis    L5-S1   Melanoma (HCC)    PONV (postoperative nausea and vomiting)    Psoriasis    Pulmonary nodules    Stable bilateral sub-6 mm pulmonary nodules    Renal cyst     Past Surgical History:  Procedure Laterality Date   ABDOMINAL EXPOSURE N/A 04/02/2016   Procedure: ABDOMINAL EXPOSURE;  Surgeon: Mayo Speck, MD;  Location: [facility] OR;  Service: Vascular;  Laterality: N/A;   ABDOMINAL HYSTERECTOMY     ANTERIOR CERVICAL DECOMP/DISCECTOMY FUSION N/A 04/16/2017   Procedure: Cervical  Four-Five, Cervical Five-Six, Cervical Six-Seven  Anterior cervical decompression/discectomy, fusion;  Surgeon: Manya Sells, MD;  Location: [facility] OR;  Service: Neurosurgery;  Laterality: N/A;  C4-5 C5-6 C6-7 Anterior cervical decompression/discectomy, fusion   ANTERIOR LUMBAR FUSION N/A 04/02/2016   Procedure: Lumbar five-Sacral one  Anterior lumbar interbody fusion with Dr. Ouida Bloom;  Surgeon: Manya Sells, MD;  Location: [facility] OR;  Service: Neurosurgery;  Laterality: N/A;  L5-S1 Anterior lumbar interbody fusion with Dr. Ena Harries Early   BRAIN SURGERY     BREAST REDUCTION SURGERY     CATARACT EXTRACTION, BILATERAL     CHOLECYSTECTOMY N/A 09/04/2018   Procedure: LAPAROSCOPIC CHOLECYSTECTOMY WITH INTRAOPERATIVE CHOLANGIOGRAM;  Surgeon: Oralee Billow, MD;  Location: [facility] ORS;  Service: General;  Laterality: N/A;   COLONOSCOPY     GALLBLADDER SURGERY  0723/2020   NECK SURGERY     bone spur    Social History   Socioeconomic History   Marital status: Married    Spouse name: Tenna Fees   Number of children: 3   Years of education: Not on file   Highest education level: Not on file  Occupational History   Not on file  Tobacco Use   Smoking status: Former    Current packs/day: 0.00    Average  packs/day: 0.5 packs/day for 15.0 years (7.5 ttl pk-yrs)    Types: Cigarettes    Start date: 29    Quit date: 2014    Years since quitting: 11.3   Smokeless tobacco: Never  Vaping Use   Vaping status: Never Used  Substance and Sexual Activity   Alcohol  use: No   Drug use: No   Sexual activity: Not on file  Other Topics Concern   Not on file  Social History Narrative   Lives with husband   Right Handed   Drinks no caffeine   Social Drivers of Corporate investment banker Strain: Not on file  Food Insecurity: Not on file  Transportation Needs: Not on file  Physical Activity: Not on file  Stress: Not on file  Social Connections: Not on file  Intimate Partner Violence: Not on file    Family  History  Problem Relation Age of Onset   Stroke Mother    Dementia Mother    Heart disease Father    Breast cancer Sister     Current Outpatient Medications  Medication Sig Dispense Refill   amLODipine  (NORVASC ) 2.5 MG tablet Take 1 tablet (2.5 mg total) by mouth daily. 90 tablet 2   escitalopram  (LEXAPRO ) 20 MG tablet Take 20 mg by mouth every evening.     furosemide  (LASIX ) 20 MG tablet Take 1 tablet (20 mg total) by mouth every other day. (Patient taking differently: Take 20 mg by mouth every other day. Take 40 mg daily for 3 days, then resume 20 mg daily.) 90 tablet 1   metoprolol  succinate (TOPROL  XL) 25 MG 24 hr tablet Take 1 tablet (25 mg total) by mouth daily. 30 tablet 11   Potassium Chloride  ER 20 MEQ TBCR Take 1 tablet by mouth daily.     rivaroxaban  (XARELTO ) 20 MG TABS tablet Take 20 mg by mouth daily with supper.     ALPRAZolam  (XANAX ) 0.5 MG tablet Take 0.5 mg by mouth 2 (two) times daily as needed for anxiety. (Patient not taking: Reported on 05/20/2023)     buPROPion (WELLBUTRIN XL) 150 MG 24 hr tablet Take 150 mg by mouth daily. (Patient not taking: Reported on 05/20/2023)     busPIRone  (BUSPAR ) 15 MG tablet Take 15 mg by mouth 3 (three) times daily. (Patient not taking: Reported on 06/04/2023)     No current facility-administered medications for this visit.    Allergies  Allergen Reactions   No Known Allergies     REVIEW OF SYSTEMS (negative unless checked):   Cardiac:  []  Chest pain or chest pressure? []  Shortness of breath upon activity? []  Shortness of breath when lying flat? []  Irregular heart rhythm?  Vascular:  []  Pain in calf, thigh, or hip brought on by walking? []  Pain in feet at night that wakes you up from your sleep? []  Blood clot in your veins? [x]  Leg swelling?  Pulmonary:  []  Oxygen at home? []  Productive cough? []  Wheezing?  Neurologic:  []  Sudden weakness in arms or legs? []  Sudden numbness in arms or legs? []  Sudden onset of difficult  speaking or slurred speech? []  Temporary loss of vision in one eye? []  Problems with dizziness?  Gastrointestinal:  []  Blood in stool? []  Vomited blood?  Genitourinary:  []  Burning when urinating? []  Blood in urine?  Psychiatric:  []  Major depression  Hematologic:  []  Bleeding problems? []  Problems with blood clotting?  Dermatologic:  []  Rashes or ulcers?  Constitutional:  []  Fever or chills?  Ear/Nose/Throat:  []  Change in hearing? []  Nose bleeds? []  Sore throat?  Musculoskeletal:  []  Back pain? []  Joint pain? []  Muscle pain?   Physical Examination     Vitals:   06/14/23 0840  BP: 125/82  Pulse: 79  Temp: (!) 97.4 F (36.3 C)  TempSrc: Temporal  SpO2: 97%  Weight: 189 lb 14.4 oz (86.1 kg)   Body mass index is 29.74 kg/m.  General:  WDWN in NAD; vital signs documented above Gait: Not observed HENT: WNL, normocephalic Pulmonary: normal non-labored breathing Cardiac: Regular Abdomen: soft, NT, no masses Skin: without rashes Vascular Exam/Pulses: BLE warm and well perfused Extremities: nonpitting edema of distal left lower leg and dorsal foot.  Mild stasis pigmentation of bilateral lower extremities.  No ulcerations  Musculoskeletal: no muscle wasting or atrophy  Neurologic: A&O X 3;  No focal weakness or paresthesias are detected Psychiatric:  The pt has Normal affect.  Non-invasive Vascular Imaging   BLE Venous Insufficiency Duplex (05/23/2023):  +--------------+---------+------+-----------+------------+--------+  RIGHT        Reflux NoRefluxReflux TimeDiameter cmsComments                          Yes                                   +--------------+---------+------+-----------+------------+--------+  CFV          no                                              +--------------+---------+------+-----------+------------+--------+  FV prox       no                                               +--------------+---------+------+-----------+------------+--------+  FV mid        no                                              +--------------+---------+------+-----------+------------+--------+  FV dist       no                                              +--------------+---------+------+-----------+------------+--------+  Popliteal    no                                              +--------------+---------+------+-----------+------------+--------+  GSV at [facility]    no                           0.763              +--------------+---------+------+-----------+------------+--------+  GSV prox thighno  0.309              +--------------+---------+------+-----------+------------+--------+  GSV mid thigh no                            0.30              +--------------+---------+------+-----------+------------+--------+  GSV dist thighno                           0.228              +--------------+---------+------+-----------+------------+--------+  GSV at knee   no                           0.256              +--------------+---------+------+-----------+------------+--------+  GSV prox calf no                           0.290              +--------------+---------+------+-----------+------------+--------+  GSV mid calf  no                           0.195              +--------------+---------+------+-----------+------------+--------+  GSV dist calf no                                              +--------------+---------+------+-----------+------------+--------+  SSV Pop Fossa no                                              +--------------+---------+------+-----------+------------+--------+  SSV prox calf no                           0.229              +--------------+---------+------+-----------+------------+--------+  SSV mid calf  no                           0.220               +--------------+---------+------+-----------+------------+--------+   +--------------+---------+------+-----------+------------+--------+  LEFT         Reflux NoRefluxReflux TimeDiameter cmsComments                          Yes                                   +--------------+---------+------+-----------+------------+--------+  CFV          no                                              +--------------+---------+------+-----------+------------+--------+  FV prox       no                                              +--------------+---------+------+-----------+------------+--------+  FV mid        no                                              +--------------+---------+------+-----------+------------+--------+  FV dist       no                                              +--------------+---------+------+-----------+------------+--------+  Popliteal    no                                              +--------------+---------+------+-----------+------------+--------+  GSV at [facility]    no                                              +--------------+---------+------+-----------+------------+--------+  GSV prox thighno                           0.366              +--------------+---------+------+-----------+------------+--------+  GSV mid thigh no                           0.376              +--------------+---------+------+-----------+------------+--------+  GSV dist thighno                           0.483              +--------------+---------+------+-----------+------------+--------+  GSV at knee   no                           0.308              +--------------+---------+------+-----------+------------+--------+  GSV prox calf no                           0.428              +--------------+---------+------+-----------+------------+--------+  GSV mid calf  no                             0.50              +--------------+---------+------+-----------+------------+--------+  GSV dist calf no                           0.320              +--------------+---------+------+-----------+------------+--------+  SSV prox calf                              0.548              +--------------+---------+------+-----------+------------+--------+  SSV mid calf  yes    >500 ms     0.289              +--------------+---------+------+-----------+------------+--------+     Medical Decision Making   Rebecca Houston is a 78 y.o. female who presents for evaluation of venous insufficiency  Based on the patient's duplex, there is no reflux in the right lower extremity.  There is reflux in the left small saphenous vein at the mid calf.  The remainder of the patient's deep and superficial venous system on the left is competent.  There is no evidence of DVT or SVT on exam.  The patient would not be a candidate for vein ablation given that she does not have significant superficial reflux. The patient has a 5+ year history of bilateral lower leg swelling, left greater than right.  She says since February her left leg swelling has significantly increased after an episode of cellulitis. Her leg swelling causes her aching discomfort. She only experiences mild benefit from compression and elevation. She has experienced no benefit with diuretic use On exam she has hyperpigmentation of bilateral lower extremities.  She has mild right lower leg swelling. She also has pitting edema of the left lower leg and foot, most consistent with lymphedema. I discussed with the patient that she has no reflux in the right lower extremity and very minimal superficial vein reflux on the left.  I explained to the patient that her left lower extremity swelling seems consistent with lymphedema.  She would benefit from continued use of compression stockings and leg elevation.  I have also encouraged her to  ask her PCP for referral to a lymphedema clinic.  She would benefit from lymphedema pumps to help with her left leg swelling.  She is not a candidate for any vein interventions. She can follow-up with our office as needed  Ron Cobbs, PA-C [facility] Office: [phone]  06/14/2023, 8:43 AM  Clinic MD: Susi Eric

## 2023-07-22 ENCOUNTER — Encounter

## 2023-10-28 ENCOUNTER — Other Ambulatory Visit: Payer: Self-pay

## 2023-10-29 MED ORDER — METOPROLOL SUCCINATE ER 25 MG PO TB24: 25.0000 mg | ORAL_TABLET | Freq: Every day | ORAL | 1 refills | Status: AC

## 2024-01-27 ENCOUNTER — Telehealth: Payer: Medicare Other | Admitting: Neurology

## 2024-02-17 ENCOUNTER — Encounter (HOSPITAL_BASED_OUTPATIENT_CLINIC_OR_DEPARTMENT_OTHER): Payer: Self-pay | Admitting: Cardiology

## 2024-02-17 ENCOUNTER — Ambulatory Visit (HOSPITAL_BASED_OUTPATIENT_CLINIC_OR_DEPARTMENT_OTHER): Admitting: Cardiology

## 2024-02-17 VITALS — BP 122/70 | HR 97 | Ht 67.0 in | Wt 186.9 lb

## 2024-02-17 DIAGNOSIS — I4811 Longstanding persistent atrial fibrillation: Secondary | ICD-10-CM

## 2024-02-17 DIAGNOSIS — Z7189 Other specified counseling: Secondary | ICD-10-CM

## 2024-02-17 DIAGNOSIS — Z8673 Personal history of transient ischemic attack (TIA), and cerebral infarction without residual deficits: Secondary | ICD-10-CM

## 2024-02-17 DIAGNOSIS — D6869 Other thrombophilia: Secondary | ICD-10-CM

## 2024-02-17 DIAGNOSIS — I10 Essential (primary) hypertension: Secondary | ICD-10-CM | POA: Diagnosis not present

## 2024-02-17 DIAGNOSIS — Z7901 Long term (current) use of anticoagulants: Secondary | ICD-10-CM | POA: Diagnosis not present

## 2024-02-17 DIAGNOSIS — R600 Localized edema: Secondary | ICD-10-CM

## 2024-02-17 NOTE — Patient Instructions (Signed)

## 2024-02-17 NOTE — Progress Notes (Signed)
 " Cardiology Office Note:  .    Date:  02/17/2024  ID:  Rebecca Houston, DOB [birth year], MRN [id number] PCP: Nancee Deward BROCKS, MD  [facility] Providers Cardiologist:  Shelda Bruckner, MD     History of Present Illness: .    Rebecca Houston is a 79 y.o. female with a hx of hypertension, atrial fibrillation, CVA (incidental on MRI 2022), hyperlipidemia, LE edema, history of syncope. She was initially seen 01/20/2019 as a new consult at the request of Nancee Deward BROCKS, MD for the evaluation and management of atrial fibrillation, hypertension, hyperlipidemia.   11/02/22 echo revealed LVEF 50-55%, normal PASP, mild mitral regurgitation, mild aortic calcifications.   Today: Still has swelling, general feeling of weakness in her legs. Doesn't have energy. Had evaluation with PCP, told everything looked good. Has a lot of stress going on in her life. Feels that her knees and ankles are always sore, working on finding shoes that help her pain. Able to walk in the store, climb stairs, but has joint pain with this. Has not pushed herself to do intentional walking/exercise.  She has wanted to minimize medications, updated medication list today.  She bruises easily. Discussed DOAC vs. Watchman again. No blood in her stool or blood in her urine. Cannot feel her afib.   Reviewed her workup for LE edema. No improvement with compression or elevation. Not interested in lymphedema pumps. Swelling has been much improved recently, only change is that they have been watching their diet more.  ROS:  Denies chest pain, shortness of breath at rest or with normal exertion. No PND, orthopnea, change LE edema or unexpected weight gain. No syncope or palpitations. ROS otherwise negative except as noted.   Studies Reviewed: SABRA         Physical Exam:    VS:  BP 122/70   Pulse 97   Ht 5' 7 (1.702 m)   Wt 186 lb 14.4 oz (84.8 kg)   SpO2 96%   BMI 29.27 kg/m    Wt Readings from Last 3 Encounters:  02/17/24  186 lb 14.4 oz (84.8 kg)  06/14/23 189 lb 14.4 oz (86.1 kg)  06/04/23 190 lb 3.2 oz (86.3 kg)    GEN: Well nourished, well developed in no acute distress HEENT: Normal, moist mucous membranes NECK: No JVD CARDIAC: Irregularly irregular rhythm, normal S1 and S2, no rubs or gallops. No murmur. VASCULAR: Radial and DP pulses 2+ bilaterally. No carotid bruits RESPIRATORY:  Clear to auscultation without rales, wheezing or rhonchi  ABDOMEN: Soft, non-tender, non-distended MUSCULOSKELETAL:  Ambulates independently SKIN: Warm and dry, no significant LE edema. Diffuse ecchymoses NEUROLOGIC:  Alert and oriented x 3. No focal neuro deficits noted. PSYCHIATRIC:  Normal affect   ASSESSMENT AND PLAN: .    General fatigue: workup has been unremarkable. Recommended generally increasing her activity and seeing if symptoms improved  Persistent vs permanent atrial fibrillation History of incidental CVA on MRI 2022 Secondary hypercoagulable state -CHA2DS2/VAS Stroke Risk Points= 5 -rate control with metoprolol  succinate2 5 mg daily -long term anticoagulation with rivaroxaban . Reviewed Cr of 0.99, stable Hgb on labs from home today. -discussed Watchman, risks/benefits. She will continue with DOAC for now but will contact me if she changes her mind. -we have discussed antiarrhythmia and cardioversion, she declines as she does not feel her afib. Likely permanent at this point -given history of CVA, I would recommend statin and goal LDL <70. Reviewed lipids from labs she brings today, LDL 102. She declines statin  Leg swelling/discoloration -much improved recently -vein study c/w minimal reflux in left small saphenous vein, otherwise unremarkable. Seen by [facility], not a candidate for ablation given limited reflux -felt to be most likely lymphedema by [facility] -minimal improvement with diuretic use -recommended for compression, elevation -reviewed echo   Hypertension: -at goal today -continue amlodipine  2.5 mg  daily -continue hydrochlorothiazide, she prefers to take BID, doesn't notice increase urination   Cardiac risk counseling and prevention recommendations: -recommend heart healthy/Mediterranean diet, with whole grains, fruits, vegetable, fish, lean meats, nuts, and olive oil. Limit salt. -recommend moderate walking, 3-5 times/week for 30-50 minutes each session. Aim for at least 150 minutes.week. Goal should be pace of 3 miles/hours, or walking 1.5 miles in 30 minutes -recommend avoidance of tobacco products. Avoid excess alcohol .  Dispo: Follow-up in 12 months, or sooner as needed.  Signed, Shelda Bruckner, MD   "

## 2024-02-18 ENCOUNTER — Ambulatory Visit: Admitting: Neurology
